# Patient Record
Sex: Female | Born: 1935 | Race: White | Hispanic: No | Smoking: Never smoker
Health system: Southern US, Community
[De-identification: ages and names within clinical notes are randomized; demographics above are authoritative.]

## PROBLEM LIST (undated history)

## (undated) DIAGNOSIS — M4800 Spinal stenosis, site unspecified: Secondary | ICD-10-CM

## (undated) DIAGNOSIS — T8859XA Other complications of anesthesia, initial encounter: Secondary | ICD-10-CM

## (undated) DIAGNOSIS — D649 Anemia, unspecified: Secondary | ICD-10-CM

## (undated) DIAGNOSIS — C801 Malignant (primary) neoplasm, unspecified: Secondary | ICD-10-CM

## (undated) DIAGNOSIS — C55 Malignant neoplasm of uterus, part unspecified: Secondary | ICD-10-CM

## (undated) DIAGNOSIS — I509 Heart failure, unspecified: Secondary | ICD-10-CM

## (undated) DIAGNOSIS — K219 Gastro-esophageal reflux disease without esophagitis: Secondary | ICD-10-CM

## (undated) DIAGNOSIS — T4145XA Adverse effect of unspecified anesthetic, initial encounter: Secondary | ICD-10-CM

## (undated) DIAGNOSIS — C50919 Malignant neoplasm of unspecified site of unspecified female breast: Secondary | ICD-10-CM

## (undated) DIAGNOSIS — Z8719 Personal history of other diseases of the digestive system: Secondary | ICD-10-CM

## (undated) DIAGNOSIS — M179 Osteoarthritis of knee, unspecified: Secondary | ICD-10-CM

## (undated) DIAGNOSIS — I10 Essential (primary) hypertension: Secondary | ICD-10-CM

## (undated) HISTORY — PX: APPENDECTOMY: SHX54

## (undated) HISTORY — PX: BREAST CYST EXCISION: SHX579

## (undated) HISTORY — DX: Heart failure, unspecified: I50.9

## (undated) HISTORY — PX: BREAST LUMPECTOMY: SHX2

## (undated) HISTORY — PX: CHOLECYSTECTOMY: SHX55

## (undated) HISTORY — PX: ABDOMINAL HYSTERECTOMY: SHX81

---
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
## 1978-01-29 HISTORY — PX: BREAST BIOPSY: SHX20

## 2003-01-30 DIAGNOSIS — C50919 Malignant neoplasm of unspecified site of unspecified female breast: Secondary | ICD-10-CM

## 2003-01-30 DIAGNOSIS — C801 Malignant (primary) neoplasm, unspecified: Secondary | ICD-10-CM

## 2003-01-30 HISTORY — DX: Malignant neoplasm of unspecified site of unspecified female breast: C50.919

## 2003-01-30 HISTORY — DX: Malignant (primary) neoplasm, unspecified: C80.1

## 2003-11-04 ENCOUNTER — Other Ambulatory Visit: Payer: Self-pay

## 2003-11-04 ENCOUNTER — Ambulatory Visit: Payer: Self-pay | Admitting: Surgery

## 2003-11-08 ENCOUNTER — Ambulatory Visit: Payer: Self-pay | Admitting: Surgery

## 2003-11-17 ENCOUNTER — Ambulatory Visit: Payer: Self-pay | Admitting: Internal Medicine

## 2003-11-24 ENCOUNTER — Inpatient Hospital Stay: Payer: Self-pay | Admitting: Surgery

## 2003-11-30 ENCOUNTER — Ambulatory Visit: Payer: Self-pay | Admitting: Internal Medicine

## 2003-12-30 ENCOUNTER — Ambulatory Visit: Payer: Self-pay | Admitting: Internal Medicine

## 2004-01-10 ENCOUNTER — Inpatient Hospital Stay: Payer: Self-pay | Admitting: Internal Medicine

## 2004-01-21 ENCOUNTER — Inpatient Hospital Stay: Payer: Self-pay | Admitting: Oncology

## 2004-01-25 ENCOUNTER — Inpatient Hospital Stay: Payer: Self-pay | Admitting: Internal Medicine

## 2004-01-30 ENCOUNTER — Ambulatory Visit: Payer: Self-pay | Admitting: Internal Medicine

## 2004-03-01 ENCOUNTER — Ambulatory Visit: Payer: Self-pay | Admitting: Internal Medicine

## 2004-03-29 ENCOUNTER — Ambulatory Visit: Payer: Self-pay | Admitting: Internal Medicine

## 2004-04-29 ENCOUNTER — Ambulatory Visit: Payer: Self-pay | Admitting: Internal Medicine

## 2004-05-29 ENCOUNTER — Ambulatory Visit: Payer: Self-pay | Admitting: Internal Medicine

## 2004-06-29 ENCOUNTER — Ambulatory Visit: Payer: Self-pay | Admitting: Internal Medicine

## 2004-07-29 ENCOUNTER — Ambulatory Visit: Payer: Self-pay | Admitting: Internal Medicine

## 2004-08-29 ENCOUNTER — Ambulatory Visit: Payer: Self-pay | Admitting: Internal Medicine

## 2004-09-29 ENCOUNTER — Ambulatory Visit: Payer: Self-pay | Admitting: Internal Medicine

## 2004-10-29 ENCOUNTER — Ambulatory Visit: Payer: Self-pay | Admitting: Internal Medicine

## 2004-11-29 ENCOUNTER — Ambulatory Visit: Payer: Self-pay | Admitting: Internal Medicine

## 2004-12-29 ENCOUNTER — Ambulatory Visit: Payer: Self-pay | Admitting: Internal Medicine

## 2005-01-29 ENCOUNTER — Ambulatory Visit: Payer: Self-pay | Admitting: Internal Medicine

## 2005-03-01 ENCOUNTER — Ambulatory Visit: Payer: Self-pay | Admitting: Internal Medicine

## 2005-03-29 ENCOUNTER — Ambulatory Visit: Payer: Self-pay | Admitting: Internal Medicine

## 2005-04-29 ENCOUNTER — Ambulatory Visit: Payer: Self-pay | Admitting: Internal Medicine

## 2005-05-02 ENCOUNTER — Ambulatory Visit: Payer: Self-pay | Admitting: Unknown Physician Specialty

## 2005-05-29 ENCOUNTER — Ambulatory Visit: Payer: Self-pay | Admitting: Internal Medicine

## 2005-06-29 ENCOUNTER — Ambulatory Visit: Payer: Self-pay | Admitting: Internal Medicine

## 2005-08-03 ENCOUNTER — Ambulatory Visit: Payer: Self-pay | Admitting: Radiation Oncology

## 2005-08-03 ENCOUNTER — Ambulatory Visit: Payer: Self-pay | Admitting: Surgery

## 2005-08-03 ENCOUNTER — Other Ambulatory Visit: Payer: Self-pay

## 2005-08-09 ENCOUNTER — Ambulatory Visit: Payer: Self-pay | Admitting: Surgery

## 2005-10-03 ENCOUNTER — Ambulatory Visit: Payer: Self-pay | Admitting: Internal Medicine

## 2005-10-29 ENCOUNTER — Ambulatory Visit: Payer: Self-pay | Admitting: Internal Medicine

## 2006-01-24 ENCOUNTER — Ambulatory Visit: Payer: Self-pay | Admitting: Surgery

## 2006-01-31 ENCOUNTER — Ambulatory Visit: Payer: Self-pay | Admitting: Internal Medicine

## 2006-03-01 ENCOUNTER — Ambulatory Visit: Payer: Self-pay | Admitting: Internal Medicine

## 2006-07-30 ENCOUNTER — Ambulatory Visit: Payer: Self-pay | Admitting: Internal Medicine

## 2006-08-27 ENCOUNTER — Ambulatory Visit: Payer: Self-pay

## 2006-08-30 ENCOUNTER — Ambulatory Visit: Payer: Self-pay | Admitting: Internal Medicine

## 2006-12-10 ENCOUNTER — Ambulatory Visit: Payer: Self-pay | Admitting: Ophthalmology

## 2006-12-10 ENCOUNTER — Other Ambulatory Visit: Payer: Self-pay

## 2006-12-11 ENCOUNTER — Ambulatory Visit: Payer: Self-pay | Admitting: Cardiology

## 2006-12-30 ENCOUNTER — Ambulatory Visit: Payer: Self-pay | Admitting: Internal Medicine

## 2007-01-01 ENCOUNTER — Ambulatory Visit: Payer: Self-pay | Admitting: Ophthalmology

## 2007-01-28 ENCOUNTER — Ambulatory Visit: Payer: Self-pay | Admitting: Internal Medicine

## 2007-01-30 ENCOUNTER — Ambulatory Visit: Payer: Self-pay | Admitting: Internal Medicine

## 2007-06-30 ENCOUNTER — Ambulatory Visit: Payer: Self-pay | Admitting: Internal Medicine

## 2007-07-28 ENCOUNTER — Ambulatory Visit: Payer: Self-pay | Admitting: Internal Medicine

## 2007-07-30 ENCOUNTER — Ambulatory Visit: Payer: Self-pay | Admitting: Internal Medicine

## 2007-08-30 ENCOUNTER — Ambulatory Visit: Payer: Self-pay | Admitting: Internal Medicine

## 2008-01-30 ENCOUNTER — Ambulatory Visit: Payer: Self-pay | Admitting: Internal Medicine

## 2008-02-02 ENCOUNTER — Ambulatory Visit: Payer: Self-pay | Admitting: Internal Medicine

## 2008-03-01 ENCOUNTER — Ambulatory Visit: Payer: Self-pay | Admitting: Internal Medicine

## 2008-07-29 ENCOUNTER — Ambulatory Visit: Payer: Self-pay | Admitting: Internal Medicine

## 2008-08-10 ENCOUNTER — Ambulatory Visit: Payer: Self-pay | Admitting: Internal Medicine

## 2008-08-29 ENCOUNTER — Ambulatory Visit: Payer: Self-pay | Admitting: Internal Medicine

## 2009-01-29 ENCOUNTER — Ambulatory Visit: Payer: Self-pay | Admitting: Internal Medicine

## 2009-02-09 ENCOUNTER — Ambulatory Visit: Payer: Self-pay | Admitting: Internal Medicine

## 2009-03-01 ENCOUNTER — Ambulatory Visit: Payer: Self-pay | Admitting: Internal Medicine

## 2009-08-29 ENCOUNTER — Ambulatory Visit: Payer: Self-pay | Admitting: Internal Medicine

## 2009-08-30 ENCOUNTER — Ambulatory Visit: Payer: Self-pay | Admitting: Internal Medicine

## 2009-09-06 ENCOUNTER — Ambulatory Visit: Payer: Self-pay | Admitting: Internal Medicine

## 2009-09-29 ENCOUNTER — Ambulatory Visit: Payer: Self-pay | Admitting: Internal Medicine

## 2009-11-02 ENCOUNTER — Ambulatory Visit: Payer: Self-pay | Admitting: Ophthalmology

## 2010-02-08 ENCOUNTER — Ambulatory Visit: Payer: Self-pay | Admitting: Internal Medicine

## 2010-03-01 ENCOUNTER — Ambulatory Visit: Payer: Self-pay | Admitting: Internal Medicine

## 2010-09-14 ENCOUNTER — Ambulatory Visit: Payer: Self-pay | Admitting: Internal Medicine

## 2010-12-29 ENCOUNTER — Ambulatory Visit: Payer: Self-pay | Admitting: Otolaryngology

## 2011-02-09 ENCOUNTER — Ambulatory Visit: Payer: Self-pay | Admitting: Internal Medicine

## 2011-02-09 LAB — CREATININE, SERUM
Creatinine: 1.22 mg/dL (ref 0.60–1.30)
EGFR (African American): 54 — ABNORMAL LOW

## 2011-02-09 LAB — CBC CANCER CENTER
Basophil #: 0 x10 3/mm (ref 0.0–0.1)
Basophil %: 0.6 %
Eosinophil #: 0.2 x10 3/mm (ref 0.0–0.7)
HCT: 34.2 % — ABNORMAL LOW (ref 35.0–47.0)
HGB: 12 g/dL (ref 12.0–16.0)
Lymphocyte %: 32.3 %
MCHC: 35.1 g/dL (ref 32.0–36.0)
Monocyte %: 7.5 %
Neutrophil #: 2.3 x10 3/mm (ref 1.4–6.5)
Platelet: 189 x10 3/mm (ref 150–440)
RDW: 14.3 % (ref 11.5–14.5)
WBC: 4.3 x10 3/mm (ref 3.6–11.0)

## 2011-02-09 LAB — HEPATIC FUNCTION PANEL A (ARMC)
Alkaline Phosphatase: 55 U/L (ref 50–136)
Bilirubin, Direct: 0.1 mg/dL (ref 0.00–0.20)
Bilirubin,Total: 0.4 mg/dL (ref 0.2–1.0)
SGPT (ALT): 21 U/L
Total Protein: 7.3 g/dL (ref 6.4–8.2)

## 2011-03-02 ENCOUNTER — Ambulatory Visit: Payer: Self-pay | Admitting: Internal Medicine

## 2011-07-11 ENCOUNTER — Ambulatory Visit: Payer: Self-pay | Admitting: Unknown Physician Specialty

## 2011-09-17 ENCOUNTER — Ambulatory Visit: Payer: Self-pay | Admitting: Internal Medicine

## 2011-09-18 ENCOUNTER — Ambulatory Visit: Payer: Self-pay | Admitting: Internal Medicine

## 2012-02-09 ENCOUNTER — Ambulatory Visit: Payer: Self-pay | Admitting: Internal Medicine

## 2012-02-11 LAB — CREATININE, SERUM
Creatinine: 1.03 mg/dL (ref 0.60–1.30)
EGFR (Non-African Amer.): 51 — ABNORMAL LOW

## 2012-02-11 LAB — HEPATIC FUNCTION PANEL A (ARMC)
Albumin: 4.1 g/dL (ref 3.4–5.0)
Bilirubin, Direct: 0.1 mg/dL (ref 0.00–0.20)
Bilirubin,Total: 0.5 mg/dL (ref 0.2–1.0)
SGOT(AST): 24 U/L (ref 15–37)
Total Protein: 7.8 g/dL (ref 6.4–8.2)

## 2012-02-11 LAB — CBC CANCER CENTER
Basophil #: 0 x10 3/mm (ref 0.0–0.1)
Basophil %: 0.7 %
Eosinophil %: 3 %
HCT: 37.2 % (ref 35.0–47.0)
HGB: 13 g/dL (ref 12.0–16.0)
Lymphocyte #: 1.6 x10 3/mm (ref 1.0–3.6)
MCH: 31.9 pg (ref 26.0–34.0)
MCHC: 35 g/dL (ref 32.0–36.0)
MCV: 91 fL (ref 80–100)
Monocyte %: 7.2 %
Neutrophil #: 3.8 x10 3/mm (ref 1.4–6.5)
RBC: 4.09 10*6/uL (ref 3.80–5.20)
RDW: 13.9 % (ref 11.5–14.5)
WBC: 6.1 x10 3/mm (ref 3.6–11.0)

## 2012-03-01 ENCOUNTER — Ambulatory Visit: Payer: Self-pay | Admitting: Internal Medicine

## 2012-03-29 ENCOUNTER — Ambulatory Visit: Payer: Self-pay | Admitting: Internal Medicine

## 2012-08-04 ENCOUNTER — Ambulatory Visit: Payer: Self-pay | Admitting: Internal Medicine

## 2013-02-11 ENCOUNTER — Ambulatory Visit: Payer: Self-pay | Admitting: Internal Medicine

## 2013-02-11 LAB — HEPATIC FUNCTION PANEL A (ARMC)
AST: 31 U/L (ref 15–37)
Albumin: 4 g/dL (ref 3.4–5.0)
Alkaline Phosphatase: 44 U/L — ABNORMAL LOW
BILIRUBIN TOTAL: 0.5 mg/dL (ref 0.2–1.0)
Bilirubin, Direct: 0.2 mg/dL (ref 0.00–0.20)
SGPT (ALT): 22 U/L (ref 12–78)
Total Protein: 7.8 g/dL (ref 6.4–8.2)

## 2013-02-11 LAB — CREATININE, SERUM
Creatinine: 0.94 mg/dL (ref 0.60–1.30)
EGFR (African American): >60
EGFR (Non-African Amer.): 56 — ABNORMAL LOW

## 2013-02-11 LAB — CBC CANCER CENTER
BASOS ABS: 0 x10 3/mm (ref 0.0–0.1)
Basophil %: 0.9 %
Eosinophil #: 0.2 x10 3/mm (ref 0.0–0.7)
Eosinophil %: 3.3 %
HCT: 38.7 % (ref 35.0–47.0)
HGB: 13.1 g/dL (ref 12.0–16.0)
Lymphocyte #: 1.3 x10 3/mm (ref 1.0–3.6)
Lymphocyte %: 25.4 %
MCH: 31.1 pg (ref 26.0–34.0)
MCHC: 33.9 g/dL (ref 32.0–36.0)
MCV: 92 fL (ref 80–100)
Monocyte #: 0.3 x10 3/mm (ref 0.2–0.9)
Monocyte %: 6.1 %
NEUTROS ABS: 3.4 x10 3/mm (ref 1.4–6.5)
Neutrophil %: 64.3 %
Platelet: 207 x10 3/mm (ref 150–440)
RBC: 4.21 10*6/uL (ref 3.80–5.20)
RDW: 14 % (ref 11.5–14.5)
WBC: 5.3 x10 3/mm (ref 3.6–11.0)

## 2013-02-16 LAB — URINALYSIS, COMPLETE
Bilirubin,UR: NEGATIVE
Blood: NEGATIVE
Glucose,UR: NEGATIVE mg/dL (ref 0–75)
Nitrite: NEGATIVE
Ph: 6 (ref 4.5–8.0)
RBC,UR: 3 /HPF (ref 0–5)
Specific Gravity: 1.018 (ref 1.003–1.030)
Squamous Epithelial: 1
WBC UR: 33 /HPF (ref 0–5)

## 2013-02-16 LAB — BASIC METABOLIC PANEL
Anion Gap: 9 (ref 7–16)
BUN: 21 mg/dL — ABNORMAL HIGH (ref 7–18)
CALCIUM: 9.8 mg/dL (ref 8.5–10.1)
CHLORIDE: 100 mmol/L (ref 98–107)
CO2: 28 mmol/L (ref 21–32)
CREATININE: 0.94 mg/dL (ref 0.60–1.30)
EGFR (African American): >60
GFR CALC NON AF AMER: 56 — AB
Glucose: 133 mg/dL — ABNORMAL HIGH (ref 65–99)
Osmolality: 279 (ref 275–301)
Potassium: 3.7 mmol/L (ref 3.5–5.1)
SODIUM: 137 mmol/L (ref 136–145)

## 2013-02-16 LAB — CBC
HCT: 39.5 % (ref 35.0–47.0)
HGB: 13.8 g/dL (ref 12.0–16.0)
MCH: 32.1 pg (ref 26.0–34.0)
MCHC: 34.8 g/dL (ref 32.0–36.0)
MCV: 92 fL (ref 80–100)
Platelet: 207 10*3/uL (ref 150–440)
RBC: 4.28 10*6/uL (ref 3.80–5.20)
RDW: 13.7 % (ref 11.5–14.5)
WBC: 7.5 10*3/uL (ref 3.6–11.0)

## 2013-02-16 LAB — MAGNESIUM: Magnesium: 1.5 mg/dL — ABNORMAL LOW

## 2013-02-16 LAB — TROPONIN I

## 2013-02-16 LAB — PRO B NATRIURETIC PEPTIDE: B-Type Natriuretic Peptide: 664 pg/mL — ABNORMAL HIGH (ref 0–450)

## 2013-02-17 ENCOUNTER — Observation Stay: Payer: Self-pay | Admitting: Family Medicine

## 2013-02-17 LAB — BASIC METABOLIC PANEL
ANION GAP: 6 — AB (ref 7–16)
BUN: 17 mg/dL (ref 7–18)
CHLORIDE: 104 mmol/L (ref 98–107)
Calcium, Total: 9.2 mg/dL (ref 8.5–10.1)
Co2: 28 mmol/L (ref 21–32)
Creatinine: 0.93 mg/dL (ref 0.60–1.30)
EGFR (Non-African Amer.): 57 — ABNORMAL LOW
GLUCOSE: 98 mg/dL (ref 65–99)
Osmolality: 277 (ref 275–301)
Potassium: 3.5 mmol/L (ref 3.5–5.1)
SODIUM: 138 mmol/L (ref 136–145)

## 2013-02-17 LAB — CBC WITH DIFFERENTIAL/PLATELET
BASOS PCT: 0.4 %
Basophil #: 0 10*3/uL (ref 0.0–0.1)
EOS ABS: 0.1 10*3/uL (ref 0.0–0.7)
Eosinophil %: 2.8 %
HCT: 32.4 % — AB (ref 35.0–47.0)
HGB: 11.4 g/dL — ABNORMAL LOW (ref 12.0–16.0)
LYMPHS PCT: 29.6 %
Lymphocyte #: 1.5 10*3/uL (ref 1.0–3.6)
MCH: 32 pg (ref 26.0–34.0)
MCHC: 35.1 g/dL (ref 32.0–36.0)
MCV: 91 fL (ref 80–100)
Monocyte #: 0.4 x10 3/mm (ref 0.2–0.9)
Monocyte %: 8.3 %
NEUTROS PCT: 58.9 %
Neutrophil #: 2.9 10*3/uL (ref 1.4–6.5)
Platelet: 184 10*3/uL (ref 150–440)
RBC: 3.56 10*6/uL — AB (ref 3.80–5.20)
RDW: 13.7 % (ref 11.5–14.5)
WBC: 4.9 10*3/uL (ref 3.6–11.0)

## 2013-02-17 LAB — MAGNESIUM: Magnesium: 2 mg/dL

## 2013-02-17 LAB — CK-MB: CK-MB: 2.2 ng/mL (ref 0.5–3.6)

## 2013-02-17 LAB — TROPONIN I

## 2013-02-18 LAB — URINE CULTURE

## 2013-02-18 LAB — LIPID PANEL
Cholesterol: 119 mg/dL (ref 0–200)
HDL Cholesterol: 52 mg/dL (ref 40–60)
Ldl Cholesterol, Calc: 46 mg/dL (ref 0–100)
TRIGLYCERIDES: 105 mg/dL (ref 0–200)
VLDL CHOLESTEROL, CALC: 21 mg/dL (ref 5–40)

## 2013-03-01 ENCOUNTER — Ambulatory Visit: Payer: Self-pay | Admitting: Internal Medicine

## 2013-04-08 ENCOUNTER — Ambulatory Visit: Payer: Self-pay | Admitting: Internal Medicine

## 2013-09-22 ENCOUNTER — Ambulatory Visit: Payer: Self-pay | Admitting: Family Medicine

## 2013-09-22 ENCOUNTER — Emergency Department: Payer: Self-pay | Admitting: Emergency Medicine

## 2013-09-25 ENCOUNTER — Encounter: Payer: Self-pay | Admitting: Family Medicine

## 2013-09-29 ENCOUNTER — Encounter: Payer: Self-pay | Admitting: Family Medicine

## 2013-10-29 ENCOUNTER — Encounter: Payer: Self-pay | Admitting: Family Medicine

## 2014-02-12 ENCOUNTER — Ambulatory Visit: Payer: Self-pay | Admitting: Internal Medicine

## 2014-02-12 LAB — CBC CANCER CENTER
BASOS ABS: 0 x10 3/mm (ref 0.0–0.1)
Basophil %: 0.8 %
EOS PCT: 4.7 %
Eosinophil #: 0.2 x10 3/mm (ref 0.0–0.7)
HCT: 34.7 % — ABNORMAL LOW (ref 35.0–47.0)
HGB: 11.9 g/dL — ABNORMAL LOW (ref 12.0–16.0)
Lymphocyte #: 1.8 x10 3/mm (ref 1.0–3.6)
Lymphocyte %: 36.6 %
MCH: 30.9 pg (ref 26.0–34.0)
MCHC: 34.2 g/dL (ref 32.0–36.0)
MCV: 90 fL (ref 80–100)
MONOS PCT: 9.6 %
Monocyte #: 0.5 x10 3/mm (ref 0.2–0.9)
NEUTROS ABS: 2.4 x10 3/mm (ref 1.4–6.5)
Neutrophil %: 48.3 %
Platelet: 189 x10 3/mm (ref 150–440)
RBC: 3.84 10*6/uL (ref 3.80–5.20)
RDW: 14.1 % (ref 11.5–14.5)
WBC: 5 x10 3/mm (ref 3.6–11.0)

## 2014-02-12 LAB — HEPATIC FUNCTION PANEL A (ARMC)
ALBUMIN: 3.6 g/dL (ref 3.4–5.0)
ALT: 15 U/L
AST: 17 U/L (ref 15–37)
Alkaline Phosphatase: 43 U/L — ABNORMAL LOW
Bilirubin, Direct: 0.1 mg/dL (ref 0.0–0.2)
Bilirubin,Total: 0.5 mg/dL (ref 0.2–1.0)
Total Protein: 7.1 g/dL (ref 6.4–8.2)

## 2014-02-12 LAB — CREATININE, SERUM
CREATININE: 1.03 mg/dL (ref 0.60–1.30)
EGFR (Non-African Amer.): 54 — ABNORMAL LOW

## 2014-03-01 ENCOUNTER — Ambulatory Visit: Payer: Self-pay | Admitting: Internal Medicine

## 2014-04-12 ENCOUNTER — Ambulatory Visit: Payer: Self-pay | Admitting: Internal Medicine

## 2014-05-22 NOTE — H&P (Signed)
PATIENT NAME:  Brandi Reid, Brandi Reid MR#:  [id number] DATE OF BIRTH:  10-13-1927  DATE OF ADMISSION:  02/16/2013  PRIMARY CARE PHYSICIAN: Dr. Domenick Gong.   REFERRING PHYSICIAN: Dr. Reita Cliche.   CHIEF COMPLAINT: Nausea and palpitations.   HISTORY OF PRESENT ILLNESS: The patient is an 81 year old, pleasant, Caucasian female with a past medical history of GERD, breast cancer status post treatment, now under surveillance, hiatal hernia. No significant heart history. Presenting to the ER with a chief complaint of nausea and palpitations. The patient actually called her son being not feeling well yesterday. She was nauseated and dry heaving. The patient's son took her to the Urgent Care where her blood pressure was extremely high and EKG was abnormal. They told him to take her to the ER immediately. The patient was brought into the ER by the son. Patients initial blood pressure was 234/99 with pulse 95. The patient was given labetalol 20 mg IV regarding her significantly high blood pressure. Subsequently, blood pressure trended down to 200 to 190s. She has received another dose of labetalol 10 mg IV, and blood pressure eventually trended down to 153/62 at around 12 hours 10 minutes. The patient's EKG has revealed bigeminy, and her magnesium being low at 1.5, the patient was given 2 g of magnesium sulfate IV. During my examination, the patient denies any chest pain or shortness of breath. The patient denies any similar complaints in the past. Not seen by any cardiologist in the past. Denies any abdominal pain, and nausea is improved. Son and daughter-in-law were at bedside. She has a history of breast cancer and  all of her treatments were discontinued in the year 2006.  PAST MEDICAL HISTORY: Hypertension, GERD, breast cancer, hiatal hernia, hemorrhoids.   PAST SURGICAL HISTORY: Hysterectomy in 1996 regarding uterine cancer, cholecystectomy, appendectomy, removal of breast cyst.   ALLERGIES: CODEINE, COMPAZINE  AND PENICILLIN.   PSYCHOSOCIAL HISTORY: Lives at home with husband. Denies any history of smoking, alcohol or illicit drug usage.   FAMILY HISTORY: Brother with history of prostate cancer and skin cancer. Maternal grandmother with stomach cancer. Maternal uncle had cancer in his bones.   REVIEW OF SYSTEMS:  CONSTITUTIONAL: Denies any fever or fatigue.  EYES: Denies blurry vision, double vision.  ENT: Denies epistaxis, discharge.  RESPIRATION: Denies cough or COPD.   CARDIOVASCULAR: Complaining of palpitations. Denies any chest pain.  GASTROINTESTINAL: Complaining of nausea. Denies vomiting, diarrhea, abdominal pain. Denies back pain.  GENITOURINARY: No dysuria or hematuria.  GYNECOLOGIC AND BREASTS: Has a history of breast cancer. Status post hysterectomy for uterine cancer.  ENDOCRINE: Denies polyuria, nocturia, thyroid problems.  HEMATOLOGIC AND LYMPHATIC: No anemia, easy bruising, bleeding.  INTEGUMENTARY: No acne, rash, lesions.  MUSCULOSKELETAL: No joint pain in the neck and back. Denies any gout.  NEUROLOGIC: Denies vertigo, ataxia.  PSYCHIATRIC: No ADD, OCD.   HOME MEDICATIONS: Pantoprazole 40 mg p.o. once daily, hydrochlorothiazide 25 mg once daily, Aleve on as needed basis.   PHYSICAL EXAMINATION:  VITAL SIGNS: Temperature 97.5, pulse 67, respirations 20, blood pressure is 153/62, pulse ox 98% on room air.  GENERAL APPEARANCE: Not in any acute distress. Moderately built and nourished.  HEENT: Normocephalic, atraumatic. Pupils are equally reacting to light and accommodation. No scleral icterus. No conjunctival injection. No sinus tenderness. Moist mucous membranes.  NECK: Supple. No JVD. No thyromegaly.  LUNGS: Clear to auscultation bilaterally. No accessory muscle usage. No anterior chest wall tenderness on palpation.  CARDIAC: S1, S2 normal. Regular rate and rhythm. No murmurs.  GASTROINTESTINAL: Soft. Bowel sounds are positive in all 4 quadrants. Nontender, nondistended. No  hepatosplenomegaly. No masses felt. No CVA tenderness.  NEUROLOGIC: Awake, alert, oriented x 3. Motor and sensory grossly intact. Reflexes are 2+.  EXTREMITIES: No edema. No cyanosis. No clubbing.  SKIN: Warm to touch. Normal turgor. No rashes. No lesions.  PSYCHIATRIC: Normal mood and affect.  MUSCULOSKELETAL: No joint effusion, tenderness, erythema. Peripheral pulses 2+.   LABORATORIES AND IMAGING STUDIES: A 12-lead EKG: Sinus tachycardia at 94 beats per minute. Normal PR interval. bigeminy. Glucose 133, BNP 664,  creatinine 0.94, sodium 137, potassium 3.7, chloride 100, CO2 28. GFR greater than 60. Anion gap 9. Serum osmolality 279. Calcium 9.8. Magnesium 1.5. Troponin less than 0.02. CBC is normal. Urinalysis: Ketones trace, nitrite negative, leukocyte esterase 3+. Chest x-ray, portable single view, has revealed no acute cardiopulmonary disease.   ASSESSMENT AND PLAN: An 81 year old Caucasian female brought into the Emergency Room for being sick. Seen by Urgent Care and sent over to the Emergency Room for abnormal EKG and palpitations.  1. Malignant hypertension: Will admit her to telemetry bed. Will start her on beta blocker and resume her home medications. Titrate medications on as needed basis.  2. Hypomagnesemia. Magnesium sulfate 2 g intravenous was given. Will check magnesium level in a.m. and also check chem-8. Cycle cardiac biomarkers. Echocardiogram will be obtained. Cardiology consult is placed to Dr. Saralyn Pilar.  3. Acute cystitis: The patient will be on intravenous levofloxacin. Urine culture is ordered which is pending at this time.  4. History of gastroesophageal reflux disease: The patient will be on proton pump inhibitor.  5. History of breast cancer, status post treatment and the patient is under surveillance: Follow up with oncology as an outpatient as scheduled.  6. Deep vein thrombosis prophylaxis with Lovenox subcutaneous.   Diagnosis and plan of care was discussed in detail  with the patient and her son at bedside. They all verbalized understanding of the plan.   TOTAL TIME SPENT ON ADMISSION: 45 minutes.   ____________________________ Nicholes Mango, MD ag:gb D: 02/17/2013 00:58:59 ET T: 02/17/2013 04:51:59 ET JOB#: [id number]  cc: Nicholes Mango, MD, <Dictator> Fonnie Jarvis. Ilene Qua, MD Isaias Cowman, MD Nicholes Mango MD ELECTRONICALLY SIGNED 03/06/2013 7:09

## 2014-05-22 NOTE — Discharge Summary (Signed)
PATIENT NAME:  Brandi Reid, Brandi Reid MR#:  [id number] DATE OF BIRTH:  [birth year]  DATE OF ADMISSION:  02/17/2013 DATE OF DISCHARGE:  02/18/2013  REASON FOR ADMISSION:  Nausea and palpitations.   FINAL DIAGNOSES: 1. Malignant hypertension.  2. Bigeminy with palpitations/cardiac arrhythmia.  3. Acute cystitis.  4. Gastroesophageal reflux.  5. Breast cancer.  6. Hypomagnesemia.   DISPOSITION: Home.   FOLLOWUP:  Dr. Cloretta Ned, cardiology at [facility].  Follow-up with Dr. Ilene Qua in 1 to 2 weeks as well.   MEDICATIONS AT DISCHARGE: Protonix 40 mg once a day, hydrochlorothiazide 25 mg once a day, aspirin 81 mg once daily, metoprolol 25 mg twice daily. Which is a new medication. Levaquin 250 mg once a day for five days.   HOSPITAL COURSE: This is a very nice 81 year old female who came with a history of feeling nauseous and having palpitations. The patient has GERD, breast cancer and she is under surveillance. She has not had any significant heart problems, but started having palpitations. Went to urgent care, her blood pressure was elevated, the 200s over 100s, 234/99, mostly with a pulse of 95. The patient received labetalol  IV with blood pressure coming down slowly.   The patient was noticed to have significant bigemini and her magnesium level was 1.5.   The patient also had some signs of urinary tract infection. Apparently increased frequency, although she takes hydrochlorothiazide. The patient had white blood cells in urine up to 33, for what we decided to treated it empirically. Urine culture was sent but is pending at this moment.   As far as her malignant hypertension. The patient was started on beta blocker, metoprolol 25 mg twice daily, and her blood pressure has been really good control during this last 24 hours for which she is going to be discharged on those medications.   We did an echocardiogram to evaluate any structural disease that will contribute to her arrhythmia and  malignant hypertension and overall the echocardiogram did not show any major abnormalities. Her ejection fraction was around 60%.   The patient also had treatment with Levaquin for five more days. The patient is discharged in good condition. Recommendations to eat magnesium rich foods or get a magnesium supplements at home over-the-counter to take for the next week or two.   The patient is discharged in good condition. Follow up with Dr. Edwin Dada as she wants to establish care with him. I spent about 45 minutes with this patient today. ____________________________ Tolna Sink, MD rsg:sg D: 02/18/2013 09:38:00 ET T: 02/18/2013 09:46:02 ET JOB#: [id number]  cc: [place] Sink, MD, <Dictator> Dr. Lessie Dings R. Ilene Qua, MD >   Brandi Reid America Brown MD ELECTRONICALLY SIGNED 02/22/2013 8:27

## 2014-05-22 NOTE — Consult Note (Signed)
PATIENT NAME:  Brandi Reid, GRUNDEN MR#:  [id number] DATE OF BIRTH:  11-19-1927  DATE OF CONSULTATION:  02/17/2013  REFERRING PHYSICIAN:   CONSULTING PHYSICIAN:  Isaias Cowman, MD  PRIMARY CARE PHYSICIAN:  Fonnie Jarvis. Ilene Qua, MD  CHIEF COMPLAINT: Nausea.   REASON FOR CONSULTATION: Consultation requested for evaluation of frequent PVCs and malignant hypertension.   HISTORY OF PRESENT ILLNESS: The patient is an 81 year old female with history of mild hypertension. She was in her usual state of health until day of admission, when she did not feel quite right, felt nauseated with dry heaving.  The patient was taken to the local urgent care where she was noted to be markedly hypertensive. The patient was taken to [facility] Emergency Room where initial blood pressure was 234/99. The patient was treated with intravenous labetalol with modest decrease in blood pressure. The patient received an additional dose with a blood pressure of 153/62 and was admitted to telemetry. Admission labs were notable for a negative troponin of 0.02. EKG did reveal frequent PVCs in a bigeminal pattern. The patient does have mild palpitations but denies chest pain.   PAST MEDICAL HISTORY: 1.  Hypertension.  2.  Gastroesophageal reflux disease.  3.  History of breast cancer.   MEDICATIONS ON ADMISSION: Hydrochlorothiazide 25 mg daily, pantoprazole 40 mg daily.   SOCIAL HISTORY: The patient is married and resides with her husband. She has a remote tobacco abuse history.   FAMILY HISTORY: No immediate family history of coronary artery disease or myocardial infarction.   REVIEW OF SYSTEMS: CONSTITUTIONAL: No fever or chills.  EYES: No blurry vision.  EARS: No hearing loss.  RESPIRATORY: No shortness of breath.  CARDIOVASCULAR: The patient has had palpitations.  GASTROINTESTINAL: The patient has had nausea with dry heaving.  GENITOURINARY:  No dysuria or hematuria.  ENDOCRINE: No polyuria or polydipsia.   HEMATOLOGICAL: No easy bruising or bleeding.  MUSCULOSKELETAL: No arthralgias or myalgias.  NEUROLOGICAL: No focal muscle weakness or numbness.  PSYCHOLOGICAL: No depression or anxiety.   PHYSICAL EXAMINATION: VITAL SIGNS: Blood pressure 131/73, pulse 64, respirations 18, temperature 98.1, pulse ox 94%.  HEENT: Pupils equal and reactive to light and accommodation.  NECK: Supple without thyromegaly.  LUNGS: Clear.  HEART: Normal JVP. Normal PMI. Regular rate and rhythm. Normal S1, S2. Grade 1/6 systolic murmur.  ABDOMEN: Soft and nontender without hepatosplenomegaly.  EXTREMITIES: No cyanosis, clubbing or edema. Pulses were intact bilaterally.  MUSCULOSKELETAL: Normal muscle tone.  NEUROLOGIC: The patient is alert and oriented x 3. Motor and sensory both grossly intact.   IMPRESSION: An 81 year old female who presents with hypertensive urgency as well as frequent premature ventricular contractions in a bigeminal pattern. Blood pressure is better controlled today. The patient appears to be ruling out for myocardial infarction by CPK isoenzymes and troponin.   RECOMMENDATIONS: 1.  Agree with overall current therapy.  2.  Pending the patient's blood pressure, may consider up-titrating beta blocker for control of blood pressure and PVCs.  3.  Review 2-D echocardiogram.  4.  Would defer further cardiac diagnostics at this time.   ____________________________ Isaias Cowman, MD ap:cs D: 02/17/2013 17:36:41 ET T: 02/17/2013 18:11:02 ET JOB#: [id number]  cc: Isaias Cowman, MD, <Dictator> Isaias Cowman MD ELECTRONICALLY SIGNED 03/17/2013 12:29

## 2015-04-19 ENCOUNTER — Other Ambulatory Visit: Payer: Self-pay | Admitting: Family Medicine

## 2015-04-19 DIAGNOSIS — Z1231 Encounter for screening mammogram for malignant neoplasm of breast: Secondary | ICD-10-CM

## 2015-04-19 DIAGNOSIS — Z853 Personal history of malignant neoplasm of breast: Secondary | ICD-10-CM

## 2015-04-20 ENCOUNTER — Ambulatory Visit
Admission: RE | Admit: 2015-04-20 | Discharge: 2015-04-20 | Disposition: A | Discharge status: Home / Self Care | Payer: Medicare Other | Source: Ambulatory Visit | Attending: Family Medicine | Admitting: Family Medicine

## 2015-04-20 DIAGNOSIS — Z853 Personal history of malignant neoplasm of breast: Secondary | ICD-10-CM

## 2015-04-20 DIAGNOSIS — Z1231 Encounter for screening mammogram for malignant neoplasm of breast: Secondary | ICD-10-CM | POA: Insufficient documentation

## 2015-04-20 HISTORY — DX: Malignant neoplasm of unspecified site of unspecified female breast: C50.919

## 2015-04-20 HISTORY — DX: Malignant (primary) neoplasm, unspecified: C80.1

## 2015-04-20 HISTORY — DX: Malignant neoplasm of uterus, part unspecified: C55

## 2016-03-18 ENCOUNTER — Encounter: Payer: Self-pay | Admitting: Emergency Medicine

## 2016-03-18 ENCOUNTER — Emergency Department: Payer: Medicare Other

## 2016-03-18 DIAGNOSIS — Z8542 Personal history of malignant neoplasm of other parts of uterus: Secondary | ICD-10-CM | POA: Insufficient documentation

## 2016-03-18 DIAGNOSIS — B029 Zoster without complications: Secondary | ICD-10-CM | POA: Insufficient documentation

## 2016-03-18 DIAGNOSIS — I10 Essential (primary) hypertension: Secondary | ICD-10-CM | POA: Insufficient documentation

## 2016-03-18 DIAGNOSIS — J189 Pneumonia, unspecified organism: Secondary | ICD-10-CM | POA: Diagnosis not present

## 2016-03-18 DIAGNOSIS — Z853 Personal history of malignant neoplasm of breast: Secondary | ICD-10-CM | POA: Insufficient documentation

## 2016-03-18 DIAGNOSIS — R531 Weakness: Secondary | ICD-10-CM | POA: Diagnosis present

## 2016-03-18 LAB — CBC
HCT: 32.2 % — ABNORMAL LOW (ref 35.0–47.0)
HEMOGLOBIN: 11.3 g/dL — AB (ref 12.0–16.0)
MCH: 31.8 pg (ref 26.0–34.0)
MCHC: 35.1 g/dL (ref 32.0–36.0)
MCV: 90.6 fL (ref 80.0–100.0)
PLATELETS: 183 10*3/uL (ref 150–440)
RBC: 3.55 MIL/uL — AB (ref 3.80–5.20)
RDW: 13.4 % (ref 11.5–14.5)
WBC: 8 10*3/uL (ref 3.6–11.0)

## 2016-03-18 LAB — BASIC METABOLIC PANEL
Anion gap: 9 (ref 5–15)
BUN: 22 mg/dL — ABNORMAL HIGH (ref 6–20)
CALCIUM: 8.8 mg/dL — AB (ref 8.9–10.3)
CO2: 25 mmol/L (ref 22–32)
CREATININE: 0.96 mg/dL (ref 0.44–1.00)
Chloride: 100 mmol/L — ABNORMAL LOW (ref 101–111)
GFR, EST NON AFRICAN AMERICAN: 52 mL/min — AB (ref >60)
Glucose, Bld: 143 mg/dL — ABNORMAL HIGH (ref 65–99)
Potassium: 3.3 mmol/L — ABNORMAL LOW (ref 3.5–5.1)
SODIUM: 134 mmol/L — AB (ref 135–145)

## 2016-03-18 NOTE — ED Triage Notes (Signed)
Pt presents to ED with productive cough and fever. Pt was dx with flu approx 1 week ago and has been taking tamiflu with one dose left. Pt family member states pt started to feel a little better with reduced fever and less severe cough. Pt symptoms worsened today with increase in fever and pt now c/o tightness in her chest and more frequent cough. Pt also has painful rash noted to her lower right side and family concerned pt may have shingles as well. Cluster of red blisters noted.

## 2016-03-19 ENCOUNTER — Emergency Department
Admission: EM | Admit: 2016-03-19 | Discharge: 2016-03-19 | Disposition: A | Discharge status: Home / Self Care | Payer: Medicare Other | Attending: Emergency Medicine | Admitting: Emergency Medicine

## 2016-03-19 DIAGNOSIS — J189 Pneumonia, unspecified organism: Secondary | ICD-10-CM

## 2016-03-19 DIAGNOSIS — B029 Zoster without complications: Secondary | ICD-10-CM

## 2016-03-19 DIAGNOSIS — R531 Weakness: Secondary | ICD-10-CM

## 2016-03-19 HISTORY — DX: Essential (primary) hypertension: I10

## 2016-03-19 MED ORDER — SODIUM CHLORIDE 0.9 % IV BOLUS (SEPSIS)
1000.0000 mL | Freq: Once | INTRAVENOUS | Status: AC
  Administered 2016-03-19: 1000 mL via INTRAVENOUS

## 2016-03-19 MED ORDER — POTASSIUM CHLORIDE CRYS ER 20 MEQ PO TBCR
20.0000 meq | EXTENDED_RELEASE_TABLET | Freq: Once | ORAL | Status: AC
  Administered 2016-03-19: 20 meq via ORAL
  Filled 2016-03-19: qty 1

## 2016-03-19 MED ORDER — AZITHROMYCIN 250 MG PO TABS: 250.0000 mg | ORAL_TABLET | Freq: Every day | ORAL | 0 refills | Status: DC

## 2016-03-19 MED ORDER — DEXTROSE 5 % IV SOLN
500.0000 mg | Freq: Once | INTRAVENOUS | Status: AC
  Administered 2016-03-19: 500 mg via INTRAVENOUS
  Filled 2016-03-19: qty 500

## 2016-03-19 NOTE — Discharge Instructions (Signed)
1. Finish antibiotic as prescribed (azithromycin 250 mg daily 4 days). Start your next dose Tuesday morning. 2. Return to the ER for worsening symptoms, persistent vomiting, difficulty breathing or other concerns.

## 2016-03-19 NOTE — ED Provider Notes (Signed)
[facility] Emergency Department Provider Note   ____________________________________________   First MD Initiated Contact with Patient 03/19/16 0107     (approximate)  I have reviewed the triage vital signs and the nursing notes.   HISTORY  Chief Complaint Fever; Cough; and Weakness    HPI Brandi Reid is a 83 y.o. female who presents to the ED from home with a chief complaint of fever and your doctor cough. Patient was diagnosed with the flu on Wednesday and started on Tamiflu; has 1 dose left. Thought she was feeling better but today developed chills, fever and worsening cough. Also reports painful rash to her right abdomen which developed on Wednesday. Reports her NP prescribed hydrocortisone cream which has not helped the rash.Complains of generalized weakness, nausea and decreased appetite. Denies associated chest pain, shortness of breath, abdominal pain, vomiting, diarrhea. Denies recent travel or trauma. Nothing makes her symptoms better or worse.   Past Medical History:  Diagnosis Date  . Breast cancer ([place]) 2005   left positive  . Cancer ([place]) 2005   lt lumpectomy/chemo and rad  . Hypertension   . Uterine cancer ([place])     There are no active problems to display for this patient.   Past Surgical History:  Procedure Laterality Date  . ABDOMINAL HYSTERECTOMY    . BREAST BIOPSY Right 1980   neg  . CHOLECYSTECTOMY      Prior to Admission medications   Medication Sig Start Date End Date Taking? Authorizing Provider  azithromycin (ZITHROMAX) 250 MG tablet Take 1 tablet (250 mg total) by mouth daily. 03/19/16   Paulette Blanch, MD    Allergies Compazine [prochlorperazine] and Penicillins  Family History  Problem Relation Age of Onset  . Breast cancer Daughter 2    Social History Social History  Substance Use Topics  . Smoking status: Never Smoker  . Smokeless tobacco: Never Used  . Alcohol use No    Review of  Systems  Constitutional: Positive for fever/chills, generalized weakness and decreased appetite. Eyes: No visual changes. ENT: No sore throat. Cardiovascular: Denies chest pain. Respiratory: Positive for cough. Denies shortness of breath. Gastrointestinal: No abdominal pain.  Positive for nausea, no vomiting.  No diarrhea.  No constipation. Genitourinary: Negative for dysuria. Musculoskeletal: Negative for back pain. Skin: Negative for rash. Neurological: Negative for headaches, focal weakness or numbness.  10-point ROS otherwise negative.  ____________________________________________   PHYSICAL EXAM:  VITAL SIGNS: ED Triage Vitals  Enc Vitals Group     BP 03/18/16 2140 (!) 120/43     Pulse Rate 03/18/16 2140 75     Resp 03/18/16 2140 20     Temp 03/18/16 2140 97.9 F (36.6 C)     Temp Source 03/18/16 2140 Oral     SpO2 03/18/16 2140 94 %     Weight 03/18/16 2142 150 lb (68 kg)     Height 03/18/16 2142 5\' 5"  (1.651 m)     Head Circumference --      Peak Flow --      Pain Score 03/18/16 2142 0     Pain Loc --      Pain Edu? --      Excl. in [place]? --     Constitutional: Alert and oriented. Frail appearing and in no acute distress. Eyes: Conjunctivae are normal. PERRL. EOMI. Head: Atraumatic. Nose: No congestion/rhinnorhea. Mouth/Throat: Mucous membranes are moist.  Oropharynx non-erythematous. Neck: No stridor.  Supple neck without meningismus. Cardiovascular: Normal rate, regular  rhythm. Grossly normal heart sounds.  Good peripheral circulation. Respiratory: Normal respiratory effort.  No retractions. Lungs CTAB. Gastrointestinal: Soft and nontender. No distention. No abdominal bruits. No CVA tenderness. Musculoskeletal: No lower extremity tenderness nor edema.  No joint effusions. Neurologic:  Normal speech and language. No gross focal neurologic deficits are appreciated. No gait instability. Skin:  Skin is warm, dry and intact. No petechiae. Patchy areas of  erythematous vesicles in various stages of healing and crusting to right lower trunk which does not cross the midline. Psychiatric: Mood and affect are normal. Speech and behavior are normal.  ____________________________________________   LABS (all labs ordered are listed, but only abnormal results are displayed)  Labs Reviewed  BASIC METABOLIC PANEL - Abnormal; Notable for the following:       Result Value   Sodium 134 (*)    Potassium 3.3 (*)    Chloride 100 (*)    Glucose, Bld 143 (*)    BUN 22 (*)    Calcium 8.8 (*)    GFR calc non Af Amer 52 (*)    All other components within normal limits  CBC - Abnormal; Notable for the following:    RBC 3.55 (*)    Hemoglobin 11.3 (*)    HCT 32.2 (*)    All other components within normal limits  URINALYSIS, COMPLETE (UACMP) WITH MICROSCOPIC  CBG MONITORING, ED   ____________________________________________  EKG  ED ECG REPORT I, Ahonesty Woodfin J, the attending physician, personally viewed and interpreted this ECG.   Date: 03/19/2016  EKG Time: 2135  Rate: 79  Rhythm: normal EKG, normal sinus rhythm  Axis: Normal  Intervals:none  ST&T Change: Nonspecific  ____________________________________________  RADIOLOGY  Chest 2 view interpreted per Dr. Tery Sanfilippo: 1. Retrocardiac airspace disease suggests left basilar pneumonia.  Follow-up imaging recommended to ensure resolution.  2. Borderline cardiomegaly with emphysema.   ____________________________________________   PROCEDURES  Procedure(s) performed: None  Procedures  Critical Care performed: No  ____________________________________________   INITIAL IMPRESSION / ASSESSMENT AND PLAN / ED COURSE  Pertinent labs & imaging results that were available during my care of the patient were reviewed by me and considered in my medical decision making (see chart for details).  83 year old female who presents with fever and cough in the setting of influenza. Small pneumonia  noted on chest x-ray. She is afebrile, not tachycardic without hypoxia and good blood pressure. Does not meet sepsis criteria. Will initiate IV fluid resuscitation, azithromycin and reassess. Will hold on antivirals for shingles as her rash has been ongoing for 5 days.      ____________________________________________   FINAL CLINICAL IMPRESSION(S) / ED DIAGNOSES  Final diagnoses:  Generalized weakness  Community acquired pneumonia of left lung, unspecified part of lung  Herpes zoster without complication      NEW MEDICATIONS STARTED DURING THIS VISIT:  New Prescriptions   AZITHROMYCIN (ZITHROMAX) 250 MG TABLET    Take 1 tablet (250 mg total) by mouth daily.     Note:  This document was prepared using Dragon voice recognition software and may include unintentional dictation errors.    Paulette Blanch, MD 03/19/16 [phone]

## 2018-10-28 ENCOUNTER — Other Ambulatory Visit: Payer: Self-pay

## 2018-10-28 ENCOUNTER — Encounter
Admission: RE | Admit: 2018-10-28 | Discharge: 2018-10-28 | Disposition: A | Discharge status: Home / Self Care | Payer: Medicare Other | Source: Ambulatory Visit | Attending: Orthopedic Surgery | Admitting: Orthopedic Surgery

## 2018-10-28 DIAGNOSIS — I10 Essential (primary) hypertension: Secondary | ICD-10-CM | POA: Diagnosis not present

## 2018-10-28 DIAGNOSIS — Z01818 Encounter for other preprocedural examination: Secondary | ICD-10-CM | POA: Diagnosis present

## 2018-10-28 DIAGNOSIS — Z20828 Contact with and (suspected) exposure to other viral communicable diseases: Secondary | ICD-10-CM | POA: Insufficient documentation

## 2018-10-28 HISTORY — DX: Personal history of other diseases of the digestive system: Z87.19

## 2018-10-28 HISTORY — DX: Other complications of anesthesia, initial encounter: T88.59XA

## 2018-10-28 HISTORY — DX: Anemia, unspecified: D64.9

## 2018-10-28 LAB — BASIC METABOLIC PANEL
Anion gap: 9 (ref 5–15)
BUN: 21 mg/dL (ref 8–23)
CO2: 27 mmol/L (ref 22–32)
Calcium: 9.9 mg/dL (ref 8.9–10.3)
Chloride: 92 mmol/L — ABNORMAL LOW (ref 98–111)
Creatinine, Ser: 0.87 mg/dL (ref 0.44–1.00)
GFR calc Af Amer: >60 mL/min (ref >60)
GFR calc non Af Amer: 59 mL/min — ABNORMAL LOW (ref >60)
Glucose, Bld: 98 mg/dL (ref 70–99)
Potassium: 4.5 mmol/L (ref 3.5–5.1)
Sodium: 128 mmol/L — ABNORMAL LOW (ref 135–145)

## 2018-10-28 LAB — SARS CORONAVIRUS 2 (TAT 6-24 HRS): SARS Coronavirus 2: NEGATIVE

## 2018-10-28 LAB — CBC
HCT: 30.8 % — ABNORMAL LOW (ref 36.0–46.0)
Hemoglobin: 10.8 g/dL — ABNORMAL LOW (ref 12.0–15.0)
MCH: 31.9 pg (ref 26.0–34.0)
MCHC: 35.1 g/dL (ref 30.0–36.0)
MCV: 90.9 fL (ref 80.0–100.0)
Platelets: 166 10*3/uL (ref 150–400)
RBC: 3.39 MIL/uL — ABNORMAL LOW (ref 3.87–5.11)
RDW: 13.7 % (ref 11.5–15.5)
WBC: 3.4 10*3/uL — ABNORMAL LOW (ref 4.0–10.5)
nRBC: 0 % (ref 0.0–0.2)

## 2018-10-28 NOTE — Patient Instructions (Signed)
Your procedure is scheduled on: 10-30-18 THURSDAY Report to Same Day Surgery 2nd floor medical mall [facility] Entrance-take elevator on left to 2nd floor.  Check in with surgery information desk.) To find out your arrival time please call [phone] between 1PM - 3PM on 10-29-18 [facility]  Remember: Instructions that are not followed completely may result in serious medical risk, up to and including death, or upon the discretion of your surgeon and anesthesiologist your surgery may need to be rescheduled.    _x___ 1. Do not eat food after midnight the night before your procedure. NO GUM OR CANDY AFTER MIDNIGHT. You may drink clear liquids up to 2 hours before you are scheduled to arrive at the hospital for your procedure.  Do not drink clear liquids within 2 hours of your scheduled arrival to the hospital.  Clear liquids include  --Water or Apple juice without pulp  --Gatorade  --Black Coffee or Clear Tea (No milk, no creamers, do not add anything to the coffee or Tea)   ____Ensure clear carbohydrate drink on the way to the hospital for bariatric patients  ____Ensure clear carbohydrate drink 3 hours before surgery.    __x__ 2. No Alcohol for 24 hours before or after surgery.   __x__3. No Smoking or e-cigarettes for 24 prior to surgery.  Do not use any chewable tobacco products for at least 6 hour prior to surgery   ____  4. Bring all medications with you on the day of surgery if instructed.    __x__ 5. Notify your doctor if there is any change in your medical condition     (cold, fever, infections).    x___6. On the morning of surgery brush your teeth with toothpaste and water.  You may rinse your mouth with mouth wash if you wish.  Do not swallow any toothpaste or mouthwash.   Do not wear jewelry, make-up, hairpins, clips or nail polish.  Do not wear lotions, powders, or perfumes.   Do not shave 48 hours prior to surgery. Men may shave face and neck.  Do not bring valuables  to the hospital.    [facility] is not responsible for any belongings or valuables.               Contacts, dentures or bridgework may not be worn into surgery.  Leave your suitcase in the car. After surgery it may be brought to your room.  For patients admitted to the hospital, discharge time is determined by your  treatment team.  _  Patients discharged the day of surgery will not be allowed to drive home.  You will need someone to drive you home and stay with you the night of your procedure.    Please read over the following fact sheets that you were given:   [facility] Preparing for Surgery  _x___ TAKE THE FOLLOWING MEDICATION THE MORNING OF SURGERY WITH A SMALL SIP OF WATER. These include:  1. METOPROLOL  2.  3.  4.  5.  6.  ____Fleets enema or Magnesium Citrate as directed.   _x___ Use CHG Soap or sage wipes as directed on instruction sheet   ____ Use inhalers on the day of surgery and bring to hospital day of surgery  ____ Stop Metformin and Janumet 2 days prior to surgery.    ____ Take 1/2 of usual insulin dose the night before surgery and none on the morning surgery.   ____ Follow recommendations from Cardiologist, Pulmonologist or PCP regarding stopping  Aspirin, Coumadin, Plavix ,Eliquis, Effient, or Pradaxa, and Pletal.  X____Stop Anti-inflammatories such as Advil, Aleve, Ibuprofen, Motrin, Naproxen, Naprosyn, Goodies powders or aspirin products NOW-OK to take Tylenol    _x___ Stop supplements until after surgery-STOP PRESERVISION NOW-MAY RESUME AFTER SURGERY   ____ Bring C-Pap to the hospital.

## 2018-10-28 NOTE — Pre-Procedure Instructions (Addendum)
Abnormal Ekg-Called Dr Ronelle Nigh regarding EKG. He compared todays EKG with the one from 2018 and wants cardiac clearance. Faxed this to Dr Rudene Christians.Called Myriam Jacobson and informed her of this clearance and that pt has not seen a cardiologist since 2015 (Dr Edwin Dada) at [facility]. Myriam Jacobson will get pt set up with cardiologist

## 2018-10-29 MED ORDER — CLINDAMYCIN PHOSPHATE 900 MG/50ML IV SOLN
900.0000 mg | Freq: Once | INTRAVENOUS | Status: AC
Start: 2018-10-29 — End: 2018-10-30
  Administered 2018-10-30: 900 mg via INTRAVENOUS

## 2018-10-30 ENCOUNTER — Ambulatory Visit
Admission: RE | Admit: 2018-10-30 | Discharge: 2018-10-30 | Disposition: A | Discharge status: Home / Self Care | Payer: Medicare Other | Attending: Orthopedic Surgery | Admitting: Orthopedic Surgery

## 2018-10-30 ENCOUNTER — Other Ambulatory Visit: Payer: Self-pay

## 2018-10-30 ENCOUNTER — Ambulatory Visit: Payer: Medicare Other | Admitting: Certified Registered"

## 2018-10-30 ENCOUNTER — Encounter
Admission: RE | Disposition: A | Discharge status: Home / Self Care | Payer: Self-pay | Source: Home / Self Care | Attending: Orthopedic Surgery

## 2018-10-30 ENCOUNTER — Encounter: Payer: Self-pay | Admitting: *Deleted

## 2018-10-30 ENCOUNTER — Ambulatory Visit: Payer: Medicare Other

## 2018-10-30 DIAGNOSIS — Z9049 Acquired absence of other specified parts of digestive tract: Secondary | ICD-10-CM | POA: Diagnosis not present

## 2018-10-30 DIAGNOSIS — F329 Major depressive disorder, single episode, unspecified: Secondary | ICD-10-CM | POA: Diagnosis not present

## 2018-10-30 DIAGNOSIS — Z806 Family history of leukemia: Secondary | ICD-10-CM | POA: Diagnosis not present

## 2018-10-30 DIAGNOSIS — I493 Ventricular premature depolarization: Secondary | ICD-10-CM | POA: Insufficient documentation

## 2018-10-30 DIAGNOSIS — Z9221 Personal history of antineoplastic chemotherapy: Secondary | ICD-10-CM | POA: Insufficient documentation

## 2018-10-30 DIAGNOSIS — Z880 Allergy status to penicillin: Secondary | ICD-10-CM | POA: Diagnosis not present

## 2018-10-30 DIAGNOSIS — Z90710 Acquired absence of both cervix and uterus: Secondary | ICD-10-CM | POA: Insufficient documentation

## 2018-10-30 DIAGNOSIS — Z923 Personal history of irradiation: Secondary | ICD-10-CM | POA: Insufficient documentation

## 2018-10-30 DIAGNOSIS — E785 Hyperlipidemia, unspecified: Secondary | ICD-10-CM | POA: Diagnosis not present

## 2018-10-30 DIAGNOSIS — Z7982 Long term (current) use of aspirin: Secondary | ICD-10-CM | POA: Diagnosis not present

## 2018-10-30 DIAGNOSIS — Z9010 Acquired absence of unspecified breast and nipple: Secondary | ICD-10-CM | POA: Insufficient documentation

## 2018-10-30 DIAGNOSIS — Z8249 Family history of ischemic heart disease and other diseases of the circulatory system: Secondary | ICD-10-CM | POA: Insufficient documentation

## 2018-10-30 DIAGNOSIS — Z419 Encounter for procedure for purposes other than remedying health state, unspecified: Secondary | ICD-10-CM

## 2018-10-30 DIAGNOSIS — Z8542 Personal history of malignant neoplasm of other parts of uterus: Secondary | ICD-10-CM | POA: Diagnosis not present

## 2018-10-30 DIAGNOSIS — M4856XA Collapsed vertebra, not elsewhere classified, lumbar region, initial encounter for fracture: Secondary | ICD-10-CM | POA: Insufficient documentation

## 2018-10-30 DIAGNOSIS — R008 Other abnormalities of heart beat: Secondary | ICD-10-CM | POA: Insufficient documentation

## 2018-10-30 DIAGNOSIS — Z803 Family history of malignant neoplasm of breast: Secondary | ICD-10-CM | POA: Insufficient documentation

## 2018-10-30 DIAGNOSIS — Z853 Personal history of malignant neoplasm of breast: Secondary | ICD-10-CM | POA: Insufficient documentation

## 2018-10-30 DIAGNOSIS — K449 Diaphragmatic hernia without obstruction or gangrene: Secondary | ICD-10-CM | POA: Diagnosis not present

## 2018-10-30 DIAGNOSIS — M1712 Unilateral primary osteoarthritis, left knee: Secondary | ICD-10-CM | POA: Insufficient documentation

## 2018-10-30 DIAGNOSIS — Z888 Allergy status to other drugs, medicaments and biological substances status: Secondary | ICD-10-CM | POA: Insufficient documentation

## 2018-10-30 DIAGNOSIS — I10 Essential (primary) hypertension: Secondary | ICD-10-CM | POA: Diagnosis not present

## 2018-10-30 DIAGNOSIS — K219 Gastro-esophageal reflux disease without esophagitis: Secondary | ICD-10-CM | POA: Insufficient documentation

## 2018-10-30 DIAGNOSIS — Z79899 Other long term (current) drug therapy: Secondary | ICD-10-CM | POA: Insufficient documentation

## 2018-10-30 DIAGNOSIS — Z823 Family history of stroke: Secondary | ICD-10-CM | POA: Insufficient documentation

## 2018-10-30 DIAGNOSIS — M4800 Spinal stenosis, site unspecified: Secondary | ICD-10-CM | POA: Diagnosis present

## 2018-10-30 HISTORY — PX: KYPHOPLASTY: SHX5884

## 2018-10-30 SURGERY — KYPHOPLASTY
Anesthesia: General | Site: Back

## 2018-10-30 MED ORDER — LABETALOL HCL 5 MG/ML IV SOLN
INTRAVENOUS | Status: DC | PRN
  Administered 2018-10-30 (×2): 5 mg via INTRAVENOUS

## 2018-10-30 MED ORDER — BUPIVACAINE-EPINEPHRINE (PF) 0.5% -1:200000 IJ SOLN
INTRAMUSCULAR | Status: AC
  Filled 2018-10-30: qty 30

## 2018-10-30 MED ORDER — HYDROCODONE-ACETAMINOPHEN 5-325 MG PO TABS: 1.0000 | ORAL_TABLET | Freq: Four times a day (QID) | ORAL | 0 refills | Status: DC | PRN

## 2018-10-30 MED ORDER — HYDRALAZINE HCL 20 MG/ML IJ SOLN
INTRAMUSCULAR | Status: AC
  Filled 2018-10-30: qty 1

## 2018-10-30 MED ORDER — GLYCOPYRROLATE 0.2 MG/ML IJ SOLN
INTRAMUSCULAR | Status: DC | PRN
  Administered 2018-10-30: 0.2 mg via INTRAVENOUS

## 2018-10-30 MED ORDER — PROPOFOL 10 MG/ML IV BOLUS
INTRAVENOUS | Status: AC
  Filled 2018-10-30: qty 20

## 2018-10-30 MED ORDER — LIDOCAINE HCL 1 % IJ SOLN
INTRAMUSCULAR | Status: DC | PRN
  Administered 2018-10-30: 5 mL
  Administered 2018-10-30: 45 mL

## 2018-10-30 MED ORDER — OXYCODONE HCL 5 MG PO TABS: 5.0000 mg | ORAL_TABLET | Freq: Once | ORAL | Status: DC | PRN

## 2018-10-30 MED ORDER — LIDOCAINE 2% (20 MG/ML) 5 ML SYRINGE
INTRAMUSCULAR | Status: DC | PRN
  Administered 2018-10-30: 25 mg via INTRAVENOUS

## 2018-10-30 MED ORDER — PROPOFOL 500 MG/50ML IV EMUL
INTRAVENOUS | Status: DC | PRN
  Administered 2018-10-30: 25 ug/kg/min via INTRAVENOUS

## 2018-10-30 MED ORDER — LACTATED RINGERS IV SOLN
INTRAVENOUS | Status: DC
  Administered 2018-10-30: 12:00:00 via INTRAVENOUS

## 2018-10-30 MED ORDER — KETAMINE HCL 50 MG/ML IJ SOLN
INTRAMUSCULAR | Status: DC | PRN
  Administered 2018-10-30: 10 mg via INTRAMUSCULAR
  Administered 2018-10-30: 15 mg via INTRAVENOUS

## 2018-10-30 MED ORDER — LIDOCAINE HCL (PF) 1 % IJ SOLN
INTRAMUSCULAR | Status: AC
  Filled 2018-10-30: qty 30

## 2018-10-30 MED ORDER — OXYCODONE HCL 5 MG/5ML PO SOLN: 5.0000 mg | Freq: Once | ORAL | Status: DC | PRN

## 2018-10-30 MED ORDER — HYDRALAZINE HCL 20 MG/ML IJ SOLN
5.0000 mg | Freq: Once | INTRAMUSCULAR | Status: AC
  Administered 2018-10-30: 14:00:00 5 mg via INTRAVENOUS

## 2018-10-30 MED ORDER — LIDOCAINE HCL (PF) 2 % IJ SOLN
INTRAMUSCULAR | Status: AC
  Filled 2018-10-30: qty 10

## 2018-10-30 MED ORDER — ONDANSETRON HCL 4 MG/2ML IJ SOLN
4.0000 mg | Freq: Once | INTRAMUSCULAR | Status: AC
  Administered 2018-10-30: 15:00:00 4 mg via INTRAVENOUS

## 2018-10-30 MED ORDER — FENTANYL CITRATE (PF) 100 MCG/2ML IJ SOLN: 25.0000 ug | INTRAMUSCULAR | Status: DC | PRN

## 2018-10-30 MED ORDER — FAMOTIDINE 20 MG PO TABS
20.0000 mg | ORAL_TABLET | Freq: Once | ORAL | Status: AC
  Administered 2018-10-30: 11:00:00 20 mg via ORAL

## 2018-10-30 MED ORDER — PROPOFOL 10 MG/ML IV BOLUS
INTRAVENOUS | Status: DC | PRN
  Administered 2018-10-30: 20 mg via INTRAVENOUS

## 2018-10-30 MED ORDER — IOHEXOL 180 MG/ML  SOLN
INTRAMUSCULAR | Status: DC | PRN
  Administered 2018-10-30: 20 mL

## 2018-10-30 MED ORDER — FAMOTIDINE 20 MG PO TABS
ORAL_TABLET | ORAL | Status: AC
  Administered 2018-10-30: 11:00:00 20 mg via ORAL
  Filled 2018-10-30: qty 1

## 2018-10-30 MED ORDER — ONDANSETRON HCL 4 MG/2ML IJ SOLN
INTRAMUSCULAR | Status: AC
  Administered 2018-10-30: 15:00:00 4 mg via INTRAVENOUS
  Filled 2018-10-30: qty 2

## 2018-10-30 MED ORDER — CLINDAMYCIN PHOSPHATE 900 MG/50ML IV SOLN
INTRAVENOUS | Status: AC
  Filled 2018-10-30: qty 50

## 2018-10-30 MED ORDER — BUPIVACAINE-EPINEPHRINE (PF) 0.5% -1:200000 IJ SOLN
INTRAMUSCULAR | Status: DC | PRN
  Administered 2018-10-30: 40 mL

## 2018-10-30 MED ORDER — GLYCOPYRROLATE 0.2 MG/ML IJ SOLN
INTRAMUSCULAR | Status: AC
  Filled 2018-10-30: qty 1

## 2018-10-30 SURGICAL SUPPLY — 20 items
CEMENT KYPHON CX01A KIT/MIXER (Cement) ×3 IMPLANT
COVER WAND RF STERILE (DRAPES) ×3 IMPLANT
DERMABOND ADVANCED (GAUZE/BANDAGES/DRESSINGS) ×4
DERMABOND ADVANCED .7 DNX12 (GAUZE/BANDAGES/DRESSINGS) ×1 IMPLANT
DEVICE BIOPSY BONE KYPHX (INSTRUMENTS) ×3 IMPLANT
DRAPE C-ARM XRAY 36X54 (DRAPES) ×3 IMPLANT
DURAPREP 26ML APPLICATOR (WOUND CARE) ×3 IMPLANT
FEE RENTAL RFA GENERATOR (MISCELLANEOUS) IMPLANT
GLOVE SURG SYN 9.0  PF PI (GLOVE) ×2
GLOVE SURG SYN 9.0 PF PI (GLOVE) ×1 IMPLANT
GOWN SRG 2XL LVL 4 RGLN SLV (GOWNS) ×1 IMPLANT
GOWN STRL NON-REIN 2XL LVL4 (GOWNS) ×2
GOWN STRL REUS W/ TWL LRG LVL3 (GOWN DISPOSABLE) ×1 IMPLANT
GOWN STRL REUS W/TWL LRG LVL3 (GOWN DISPOSABLE) ×2
PACK KYPHOPLASTY (MISCELLANEOUS) ×3 IMPLANT
RENTAL RFA  GENERATOR (MISCELLANEOUS)
RENTAL RFA GENERATOR (MISCELLANEOUS) IMPLANT
STRAP SAFETY 5IN WIDE (MISCELLANEOUS) ×3 IMPLANT
TRAY KYPHOPAK 15/3 EXPRESS 1ST (MISCELLANEOUS) ×5 IMPLANT
TRAY KYPHOPAK 20/3 EXPRESS 1ST (MISCELLANEOUS) ×1 IMPLANT

## 2018-10-30 NOTE — Anesthesia Post-op Follow-up Note (Signed)
Anesthesia QCDR form completed.        

## 2018-10-30 NOTE — OR Nursing (Signed)
Patient with systolic BP of [id number]. Dr. Amie Critchley notified. Patient is in pain and procedure is soon to start.

## 2018-10-30 NOTE — Op Note (Signed)
Date Oct 30, 2018  Time  1:20 pm   PATIENT: Brandi Reid   PRE-OPERATIVE DIAGNOSIS:  closed wedge compression fracture of L2, 3   POST-OPERATIVE DIAGNOSIS:  closed wedge compression fracture of L 2, 3   PROCEDURE:  Procedure(s): KYPHOPLASTY L 2, 3  SURGEON: Laurene Footman, MD   ASSISTANTS: None   ANESTHESIA:   local and MAC   EBL:  No intake/output data recorded.   BLOOD ADMINISTERED:none   DRAINS: none    LOCAL MEDICATIONS USED:  MARCAINE    and XYLOCAINE    SPECIMEN:   L2 and L3 vertebral body biopsies   DISPOSITION OF SPECIMEN:  Pathology   COUNTS:  YES   TOURNIQUET:  * No tourniquets in log *   IMPLANTS: Bone cement   DICTATION: .Dragon Dictation  patient was brought to the operating room and after adequate anesthesia was obtained the patient was placed prone.  C arm was brought in in good visualization of the affected level obtained on both AP and lateral projections.  After patient identification and timeout procedures were completed, local anesthetic was infiltrated with 10 cc 1% Xylocaine infiltrated subcutaneously.  This is done the area on the both sides of the planned approach at L 2 and L3.  The back was then prepped and draped in the usual sterile manner and repeat timeout procedure carried out.  A spinal needle was brought down to the pedicle on the both sides of L2 and L3 and a 50-50 mix of 1% Xylocaine half percent Sensorcaine with epinephrine total of 20 cc injected at each site.  After allowing this to set a small incision was made and the trocar was advanced into the vertebral body in an trans-pedicular fashion on each side and both levels.  Biopsy was obtained at each level Drilling was carried out balloon inserted with inflation to  about 2-1/2 cc for each level.  When the cement was appropriate consistency 3 cc were injected into each side of the vertebral body without extravasation, good fill superior to inferior endplates and from right to left sides  along the inferior endplate.  After the cement had set the trochar was removed and permanent C-arm views obtained.  The wound was closed with Dermabond followed by Band-Aid   PLAN OF CARE: Discharge to home after PACU   PATIENT DISPOSITION:  PACU - hemodynamically stable.

## 2018-10-30 NOTE — Discharge Instructions (Signed)
Take it easy today and tomorrow.  Try to resume more normal activities starting on Thursday.  Try not to lift anything over 5 pounds for the next 2 weeks.  Remove Band-Aids on Thursday then okay to shower.  Call office if you are having problems.  AMBULATORY SURGERY  DISCHARGE INSTRUCTIONS   1) The drugs that you were given will stay in your system until tomorrow so for the next 24 hours you should not:  A) Drive an automobile B) Make any legal decisions C) Drink any alcoholic beverage   2) You may resume regular meals tomorrow.  Today it is better to start with liquids and gradually work up to solid foods.  You may eat anything you prefer, but it is better to start with liquids, then soup and crackers, and gradually work up to solid foods.   3) Please notify your doctor immediately if you have any unusual bleeding, trouble breathing, redness and pain at the surgery site, drainage, fever, or pain not relieved by medication.    4) Additional Instructions:        Please contact your physician with any problems or Same Day Surgery at [phone], Monday through Friday 6 am to 4 pm, or [place] at [facility] number at [phone].

## 2018-10-30 NOTE — Anesthesia Preprocedure Evaluation (Signed)
Anesthesia Evaluation  Patient identified by MRN, date of birth, ID band Patient awake    Reviewed: Allergy & Precautions, H&P , NPO status , Patient's Chart, lab work & pertinent test results  History of Anesthesia Complications (+) history of anesthetic complications  Airway Mallampati: III  TM Distance: <3 FB Neck ROM: limited    Dental  (+) Upper Dentures, Lower Dentures   Pulmonary neg pulmonary ROS, neg shortness of breath,           Cardiovascular Exercise Tolerance: Good hypertension, (-) angina(-) Past MI and (-) DOE      Neuro/Psych negative neurological ROS  negative psych ROS   GI/Hepatic Neg liver ROS, hiatal hernia, GERD  Medicated and Controlled,  Endo/Other  negative endocrine ROS  Renal/GU negative Renal ROS  negative genitourinary   Musculoskeletal   Abdominal   Peds  Hematology negative hematology ROS (+)   Anesthesia Other Findings Past Medical History: No date: Anemia 2005: Breast cancer ([place])     Comment:  left positive 2005: Cancer ([place])     Comment:  lt lumpectomy/chemo and rad No date: Complication of anesthesia     Comment:  pt states she broke out in a sweat and felt like she was              going to pass out during a dental procedure-no problems               with surgeries though No date: History of hiatal hernia No date: Hypertension No date: Uterine cancer ([place])  Past Surgical History: No date: ABDOMINAL HYSTERECTOMY No date: APPENDECTOMY     Comment:  age [age]: BREAST BIOPSY; Right     Comment:  neg No date: BREAST CYST EXCISION; Right No date: CHOLECYSTECTOMY     Reproductive/Obstetrics negative OB ROS                             Anesthesia Physical Anesthesia Plan  ASA: III  Anesthesia Plan: General   Post-op Pain Management:    Induction: Intravenous  PONV Risk Score and Plan: Propofol infusion and TIVA  Airway Management  Planned: Natural Airway and Nasal Cannula  Additional Equipment:   Intra-op Plan:   Post-operative Plan:   Informed Consent: I have reviewed the patients History and Physical, chart, labs and discussed the procedure including the risks, benefits and alternatives for the proposed anesthesia with the patient or authorized representative who has indicated his/her understanding and acceptance.     Dental Advisory Given  Plan Discussed with: Anesthesiologist, CRNA and Surgeon  Anesthesia Plan Comments: (Patient consented for risks of anesthesia including but not limited to:  - adverse reactions to medications - risk of intubation if required - damage to teeth, lips or other oral mucosa - sore throat or hoarseness - Damage to heart, brain, lungs or loss of life  Patient voiced understanding.)        Anesthesia Quick Evaluation

## 2018-10-30 NOTE — Transfer of Care (Signed)
Immediate Anesthesia Transfer of Care Note  Patient: Brandi Reid  Procedure(s) Performed: L2 , L3 KYPHOPLASTY (N/A Back)  Patient Location: PACU  Anesthesia Type:General  Level of Consciousness: awake  Airway & Oxygen Therapy: Patient Spontanous Breathing and Patient connected to nasal cannula oxygen  Post-op Assessment: Report given to RN and Post -op Vital signs reviewed and stable  Post vital signs: Reviewed  Last Vitals:  Vitals Value Taken Time  BP 164/68 10/30/18 1323  Temp 36.2 C 10/30/18 1323  Pulse 67 10/30/18 1323  Resp 16 10/30/18 1323  SpO2 100 % 10/30/18 1323    Last Pain:  Vitals:   10/30/18 1116  TempSrc: Temporal  PainSc: 7       Patients Stated Pain Goal: 3 ([id number])  Complications: No apparent anesthesia complications

## 2018-10-30 NOTE — H&P (Signed)
Reviewed paper H+P, will be scanned into chart. No changes noted.  

## 2018-10-30 NOTE — Anesthesia Postprocedure Evaluation (Signed)
Anesthesia Post Note  Patient: Water quality scientist  Procedure(s) Performed: L2 , L3 KYPHOPLASTY (N/A Back)  Patient location during evaluation: PACU Anesthesia Type: General Level of consciousness: awake and alert Pain management: pain level controlled Vital Signs Assessment: post-procedure vital signs reviewed and stable Respiratory status: spontaneous breathing, nonlabored ventilation, respiratory function stable and patient connected to nasal cannula oxygen Cardiovascular status: blood pressure returned to baseline and stable Postop Assessment: no apparent nausea or vomiting Anesthetic complications: no     Last Vitals:  Vitals:   10/30/18 1407 10/30/18 1436  BP: (!) 161/64 (!) 147/49  Pulse: 71 68  Resp: 13 16  Temp:  36.6 C  SpO2: 100% 100%    Last Pain:  Vitals:   10/30/18 1436  TempSrc: Temporal  PainSc: 2                  Brandi Reid

## 2018-10-31 ENCOUNTER — Encounter: Payer: Self-pay | Admitting: Orthopedic Surgery

## 2018-10-31 LAB — SURGICAL PATHOLOGY

## 2018-11-17 ENCOUNTER — Other Ambulatory Visit: Payer: Self-pay | Admitting: Orthopedic Surgery

## 2018-11-17 DIAGNOSIS — M4807 Spinal stenosis, lumbosacral region: Secondary | ICD-10-CM

## 2018-11-18 ENCOUNTER — Ambulatory Visit
Admission: RE | Admit: 2018-11-18 | Discharge: 2018-11-18 | Disposition: A | Discharge status: Home / Self Care | Payer: Medicare Other | Source: Ambulatory Visit | Attending: Orthopedic Surgery | Admitting: Orthopedic Surgery

## 2018-11-18 ENCOUNTER — Other Ambulatory Visit: Payer: Self-pay

## 2018-11-18 DIAGNOSIS — M4807 Spinal stenosis, lumbosacral region: Secondary | ICD-10-CM | POA: Diagnosis present

## 2019-02-04 ENCOUNTER — Ambulatory Visit: Payer: Medicare Other | Attending: Internal Medicine

## 2019-02-04 DIAGNOSIS — Z20822 Contact with and (suspected) exposure to covid-19: Secondary | ICD-10-CM

## 2019-02-05 LAB — NOVEL CORONAVIRUS, NAA: SARS-CoV-2, NAA: DETECTED — AB

## 2021-07-27 IMAGING — MR MR LUMBAR SPINE W/O CM
4 of 5 series · 33 of 48 positions shown · non-contrast
Comparison: C-arm images 10 [DATE].  Lumbar MRI 07/11/2011

CLINICAL DATA: Kyphoplasty L2 and L3 10/30/2018. Low back pain with
left leg pain

EXAM:
MRI LUMBAR SPINE WITHOUT CONTRAST
TECHNIQUE: Multiplanar, multisequence MR imaging of the lumbar spine was
performed. No intravenous contrast was administered.

[Series 5: T2 · sagittal · 4.0mm · 0.81mm/px · 8 of 19 slices shown (1 of 2)]
[im 1/19]
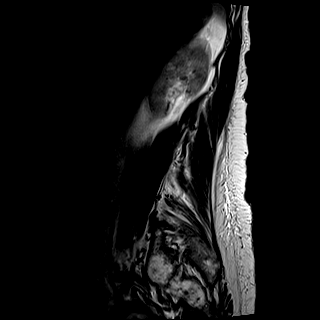
[im 3/19]
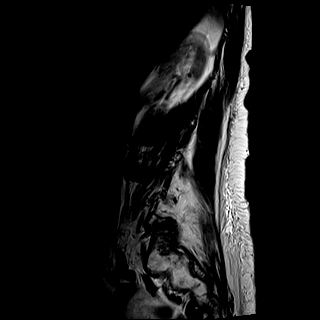
[im 6/19]
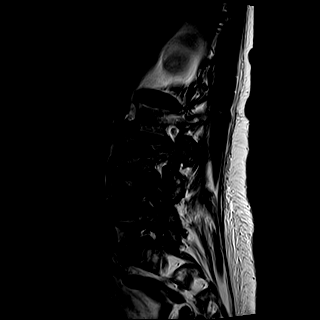
[im 8/19]
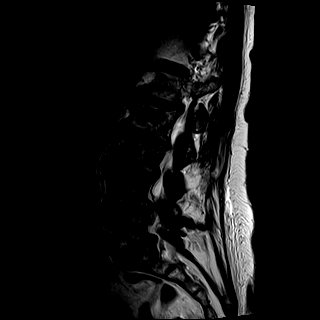
[im 11/19]
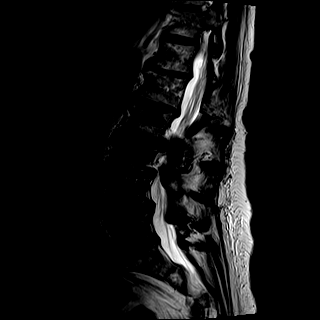
[im 13/19]
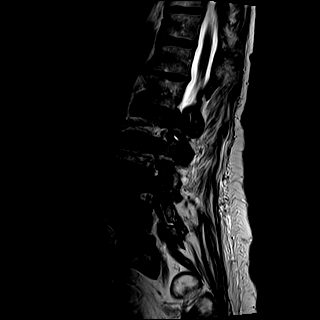
[im 16/19]
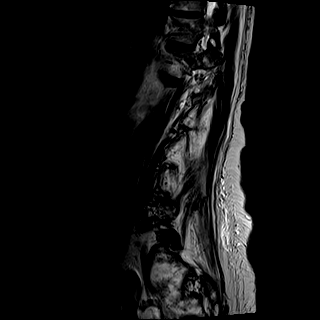
[im 19/19]
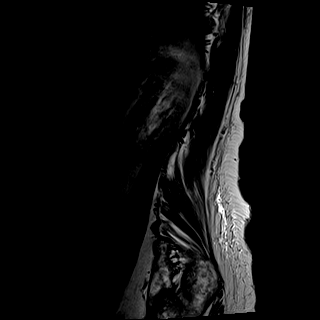

[Series 6: T1 · sagittal · 4.0mm · 0.81mm/px · 7 of 19 slices shown (1 of 2)]
[im 1/19]
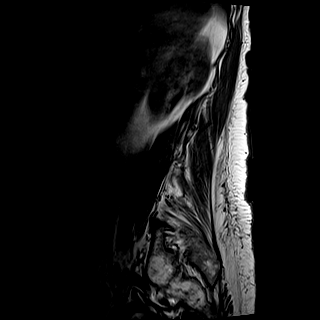
[im 4/19]
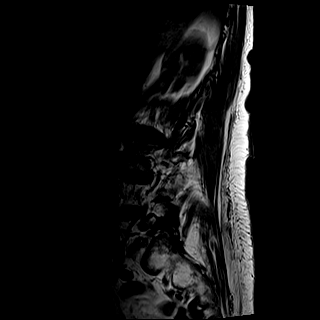
[im 7/19]
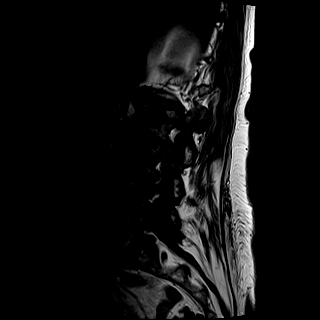
[im 10/19]
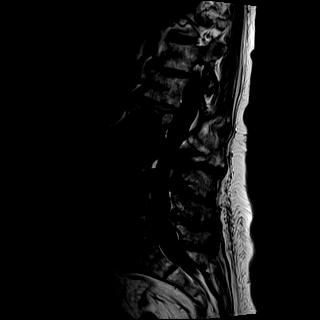
[im 13/19]
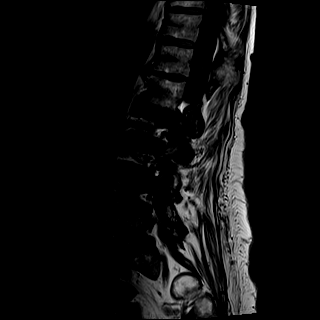
[im 16/19]
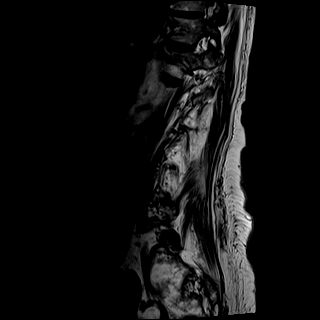
[im 19/19]
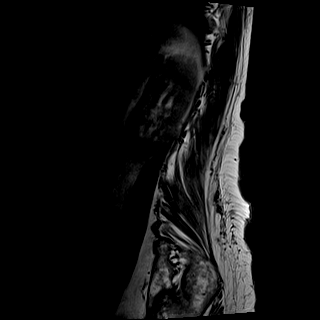

[Series 8: T2 · axial · 4.0mm · 0.78mm/px · z∈[-118,+90]mm · 9 of 35 slices shown (2 of 2)]
[im 1/35]
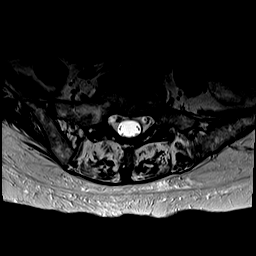
[im 6/35]
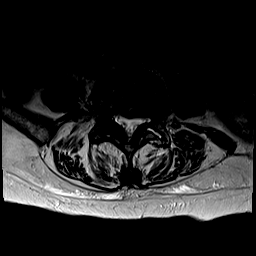
[im 12/35]
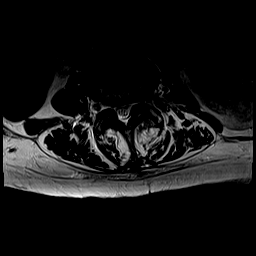
[im 15/35]
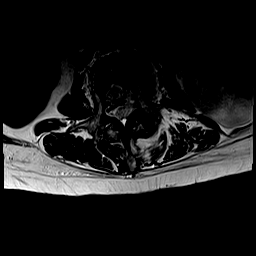
[im 18/35]
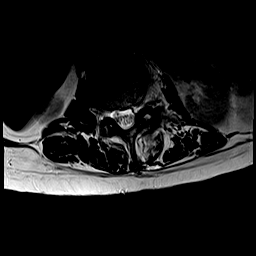
[im 20/35]
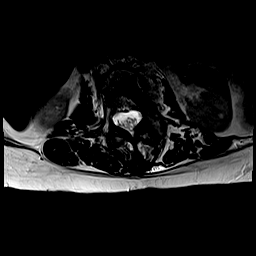
[im 23/35]
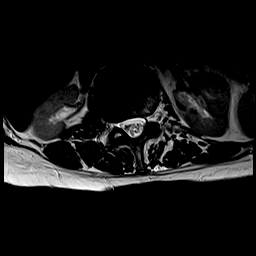
[im 29/35]
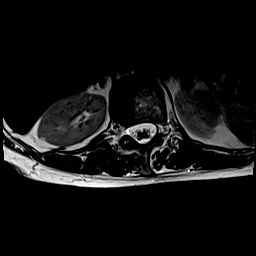
[im 35/35]
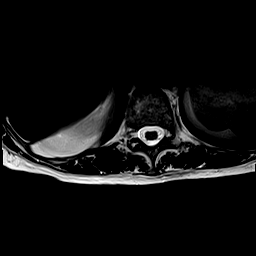

[Series 9: T1 · axial · 4.0mm · 0.39mm/px · z∈[-118,+90]mm · 9 of 35 slices shown (2 of 2)]
[im 1/35]
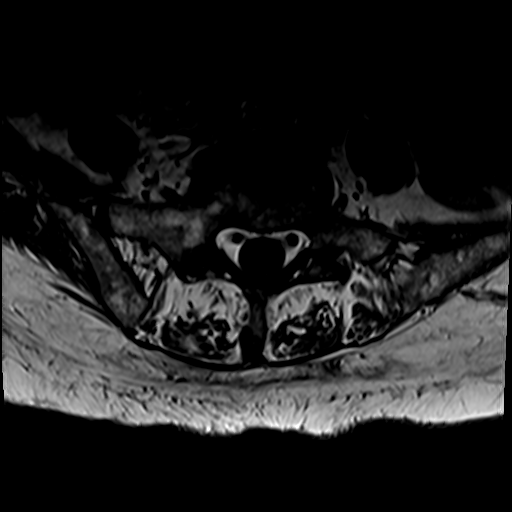
[im 6/35]
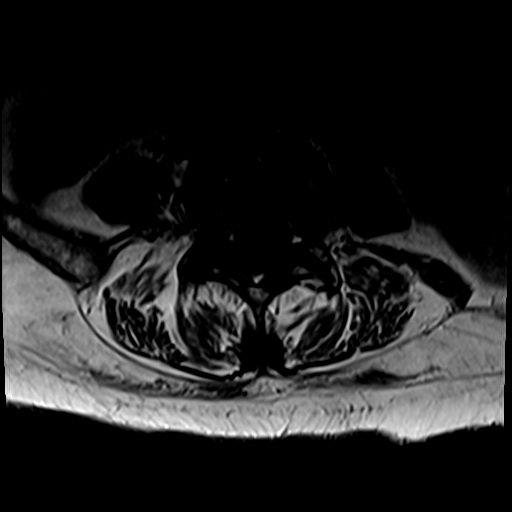
[im 12/35]
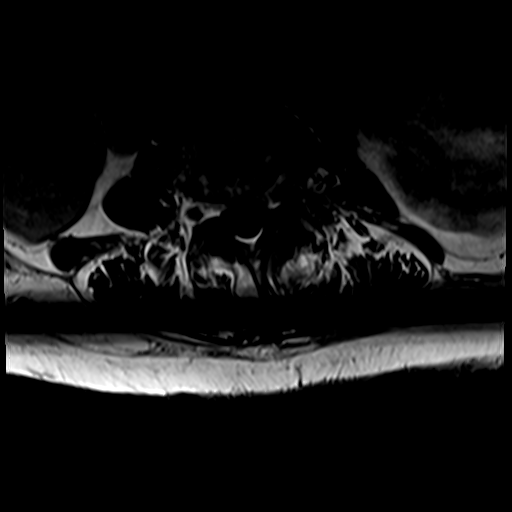
[im 15/35]
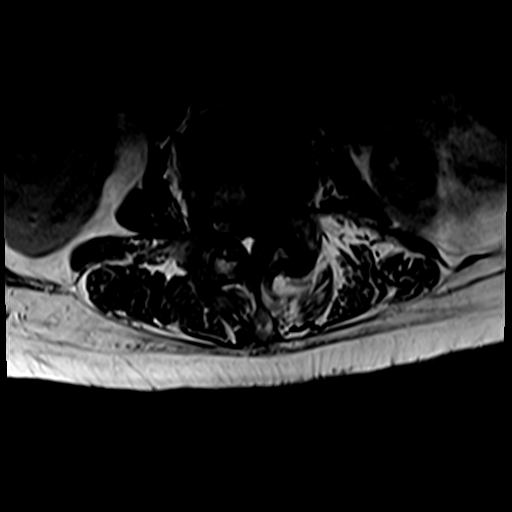
[im 18/35]
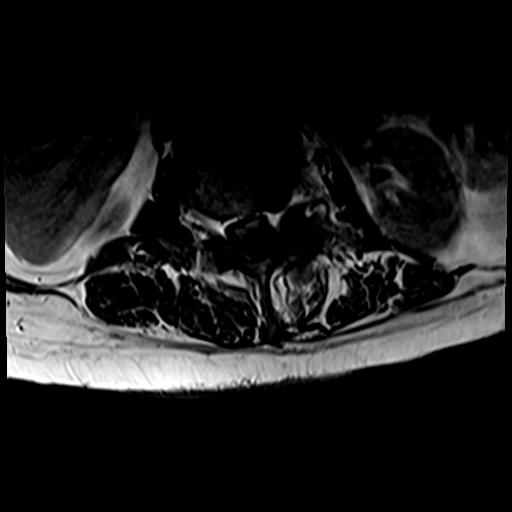
[im 20/35]
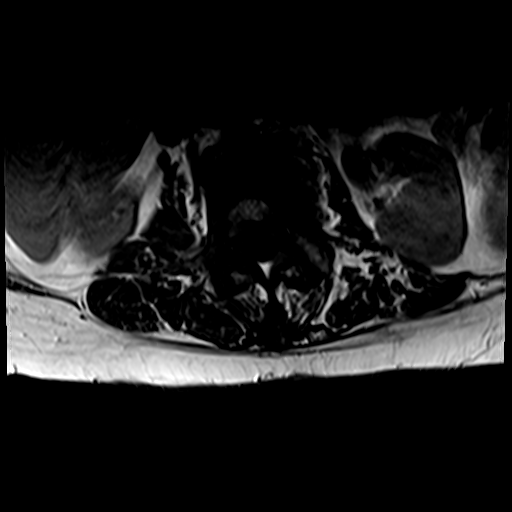
[im 23/35]
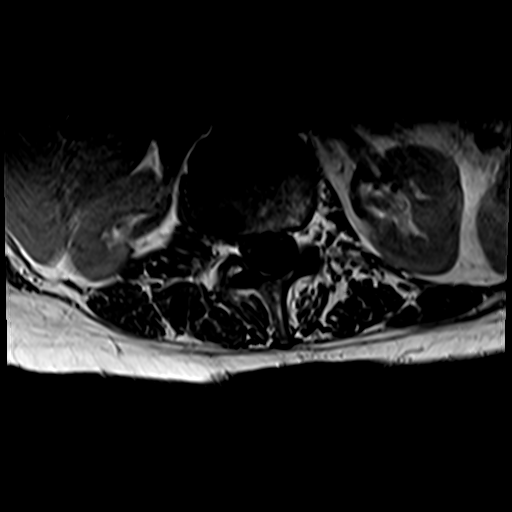
[im 29/35]
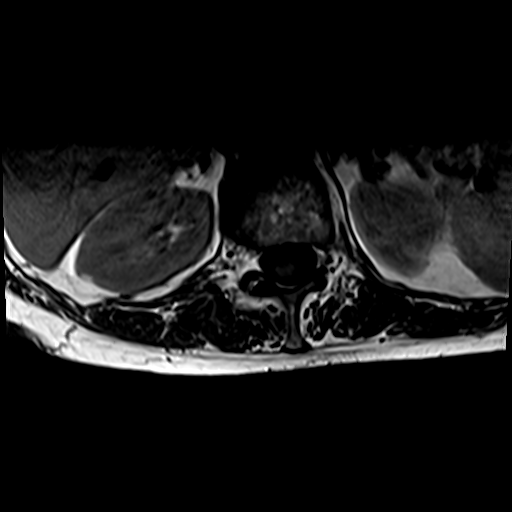
[im 35/35]
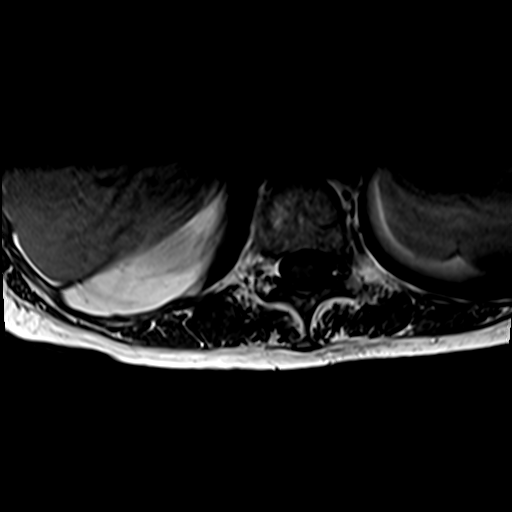

[33 of 48 positions shown; findings below may reference images not displayed]

FINDINGS: Segmentation:  Normal

Alignment: Mild retrolisthesis L1-2 and L2-3. 4 mm anterolisthesis
L3-4.

Vertebrae: Vertebroplasty cement in the vertebral bodies of L2 and
L3. No significant loss of height. No acute fracture in the lumbar
spine. Sacrum negative for fracture.

Conus medullaris and cauda equina: Conus extends to the L1-2 level.
Conus and cauda equina appear normal.

Paraspinal and other soft tissues: Negative for paraspinous mass or
edema. No fluid collection or hematoma.

Disc levels:

T12-L1: Disc degeneration without stenosis

L1-2: Disc bulging and mild facet degeneration. Mild subarticular
stenosis bilaterally

L2-3: Diffuse bulging of the disc with endplate spurring. Bilateral
facet hypertrophy. Severe spinal stenosis. Severe subarticular and
foraminal stenosis on the left. Moderate subarticular stenosis on
the right.

L3-4: 4 mm anterolisthesis. Disc and facet degeneration. Moderate
spinal stenosis. Mild subarticular stenosis on the right. Moderate
subarticular and foraminal stenosis on the left

L4-5: Disc degeneration with disc bulging and spurring. Bilateral
facet degeneration. Moderate subarticular and foraminal stenosis on
the right. Mild spinal stenosis

L5-S1: Disc degeneration and facet degeneration. Mild subarticular
stenosis on the right.
IMPRESSION: 1. Recent kyphoplasty L2 and L3 vertebral bodies. Cement in good
position. No evidence of fracture
2. Severe spinal stenosis L2-3 with disc and facet degeneration.
Severe subarticular and foraminal stenosis on the left and moderate
subarticular stenosis on the right
3. Moderate spinal stenosis L3-4 with mild subarticular stenosis on
the right and moderate subarticular and foraminal stenosis on the
left
4. Moderate subarticular foraminal stenosis on the right at L4-5
with mild spinal stenosis.

## 2021-09-09 ENCOUNTER — Other Ambulatory Visit: Payer: Self-pay

## 2021-09-09 ENCOUNTER — Emergency Department
Admission: EM | Admit: 2021-09-09 | Discharge: 2021-09-09 | Discharge status: Against Medical Advice | Payer: Medicare Other | Attending: Emergency Medicine | Admitting: Emergency Medicine

## 2021-09-09 ENCOUNTER — Encounter: Payer: Self-pay | Admitting: Emergency Medicine

## 2021-09-09 DIAGNOSIS — Z5321 Procedure and treatment not carried out due to patient leaving prior to being seen by health care provider: Secondary | ICD-10-CM | POA: Diagnosis not present

## 2021-09-09 DIAGNOSIS — Y92007 Garden or yard of unspecified non-institutional (private) residence as the place of occurrence of the external cause: Secondary | ICD-10-CM | POA: Insufficient documentation

## 2021-09-09 DIAGNOSIS — T63444A Toxic effect of venom of bees, undetermined, initial encounter: Secondary | ICD-10-CM | POA: Diagnosis not present

## 2021-09-09 MED ORDER — IBUPROFEN 400 MG PO TABS
400.0000 mg | ORAL_TABLET | Freq: Once | ORAL | Status: AC
  Administered 2021-09-09: 400 mg via ORAL
  Filled 2021-09-09: qty 1

## 2021-09-09 MED ORDER — HYDROCORTISONE 1 % EX OINT
TOPICAL_OINTMENT | Freq: Two times a day (BID) | CUTANEOUS | Status: DC
  Administered 2021-09-09: 1 via TOPICAL
  Filled 2021-09-09: qty 28.35

## 2021-09-09 MED ORDER — ACETAMINOPHEN 500 MG PO TABS
1000.0000 mg | ORAL_TABLET | Freq: Once | ORAL | Status: AC
  Administered 2021-09-09: 1000 mg via ORAL
  Filled 2021-09-09: qty 2

## 2021-09-09 NOTE — ED Triage Notes (Signed)
Pt reports she was working in the yard and got stung by bees about 12 times. Pt is hard of hearing forgot her hearing aids. Pt talks in complete sentences.  Pt talks in complete sentences

## 2021-09-09 NOTE — ED Notes (Signed)
Pt daughter to desk, states her mom feels much better, no worsening swelling or concerns and no trouble breathing.  Patient wants to leave, was encouraged to stay.  Daughter states will return if anything worsens.

## 2021-09-09 NOTE — ED Notes (Signed)
Pt reports pain to left index finger and let upper arm, right leg localized redness and mild swelling noted

## 2022-03-25 ENCOUNTER — Encounter: Payer: Self-pay | Admitting: Internal Medicine

## 2022-03-25 ENCOUNTER — Other Ambulatory Visit: Payer: Self-pay

## 2022-03-25 ENCOUNTER — Emergency Department: Payer: Medicare Other

## 2022-03-25 ENCOUNTER — Inpatient Hospital Stay
Admission: EM | Admit: 2022-03-25 | Discharge: 2022-03-31 | DRG: 186 | Disposition: A | Discharge status: Home Health | Payer: Medicare Other | Attending: Internal Medicine | Admitting: Internal Medicine

## 2022-03-25 DIAGNOSIS — I083 Combined rheumatic disorders of mitral, aortic and tricuspid valves: Secondary | ICD-10-CM | POA: Diagnosis present

## 2022-03-25 DIAGNOSIS — I951 Orthostatic hypotension: Secondary | ICD-10-CM | POA: Diagnosis present

## 2022-03-25 DIAGNOSIS — I159 Secondary hypertension, unspecified: Secondary | ICD-10-CM | POA: Diagnosis not present

## 2022-03-25 DIAGNOSIS — M549 Dorsalgia, unspecified: Secondary | ICD-10-CM | POA: Diagnosis present

## 2022-03-25 DIAGNOSIS — D61818 Other pancytopenia: Secondary | ICD-10-CM | POA: Diagnosis present

## 2022-03-25 DIAGNOSIS — M170 Bilateral primary osteoarthritis of knee: Secondary | ICD-10-CM | POA: Diagnosis present

## 2022-03-25 DIAGNOSIS — Z7189 Other specified counseling: Secondary | ICD-10-CM | POA: Diagnosis not present

## 2022-03-25 DIAGNOSIS — E785 Hyperlipidemia, unspecified: Secondary | ICD-10-CM | POA: Diagnosis present

## 2022-03-25 DIAGNOSIS — I110 Hypertensive heart disease with heart failure: Secondary | ICD-10-CM | POA: Diagnosis present

## 2022-03-25 DIAGNOSIS — Z880 Allergy status to penicillin: Secondary | ICD-10-CM

## 2022-03-25 DIAGNOSIS — I5031 Acute diastolic (congestive) heart failure: Secondary | ICD-10-CM | POA: Diagnosis not present

## 2022-03-25 DIAGNOSIS — Z87891 Personal history of nicotine dependence: Secondary | ICD-10-CM | POA: Diagnosis not present

## 2022-03-25 DIAGNOSIS — J9601 Acute respiratory failure with hypoxia: Secondary | ICD-10-CM

## 2022-03-25 DIAGNOSIS — Z888 Allergy status to other drugs, medicaments and biological substances status: Secondary | ICD-10-CM

## 2022-03-25 DIAGNOSIS — I5033 Acute on chronic diastolic (congestive) heart failure: Secondary | ICD-10-CM | POA: Diagnosis present

## 2022-03-25 DIAGNOSIS — H9190 Unspecified hearing loss, unspecified ear: Secondary | ICD-10-CM | POA: Diagnosis present

## 2022-03-25 DIAGNOSIS — Z8542 Personal history of malignant neoplasm of other parts of uterus: Secondary | ICD-10-CM | POA: Diagnosis not present

## 2022-03-25 DIAGNOSIS — K219 Gastro-esophageal reflux disease without esophagitis: Secondary | ICD-10-CM | POA: Diagnosis present

## 2022-03-25 DIAGNOSIS — Z90710 Acquired absence of both cervix and uterus: Secondary | ICD-10-CM | POA: Diagnosis not present

## 2022-03-25 DIAGNOSIS — G8929 Other chronic pain: Secondary | ICD-10-CM | POA: Diagnosis present

## 2022-03-25 DIAGNOSIS — Z9012 Acquired absence of left breast and nipple: Secondary | ICD-10-CM | POA: Diagnosis not present

## 2022-03-25 DIAGNOSIS — Z66 Do not resuscitate: Secondary | ICD-10-CM | POA: Diagnosis present

## 2022-03-25 DIAGNOSIS — Z9049 Acquired absence of other specified parts of digestive tract: Secondary | ICD-10-CM | POA: Diagnosis not present

## 2022-03-25 DIAGNOSIS — Z853 Personal history of malignant neoplasm of breast: Secondary | ICD-10-CM

## 2022-03-25 DIAGNOSIS — Z789 Other specified health status: Secondary | ICD-10-CM | POA: Insufficient documentation

## 2022-03-25 DIAGNOSIS — I509 Heart failure, unspecified: Secondary | ICD-10-CM

## 2022-03-25 DIAGNOSIS — Z9889 Other specified postprocedural states: Secondary | ICD-10-CM

## 2022-03-25 DIAGNOSIS — Z79899 Other long term (current) drug therapy: Secondary | ICD-10-CM | POA: Diagnosis not present

## 2022-03-25 DIAGNOSIS — I2489 Other forms of acute ischemic heart disease: Secondary | ICD-10-CM | POA: Diagnosis present

## 2022-03-25 DIAGNOSIS — J90 Pleural effusion, not elsewhere classified: Principal | ICD-10-CM | POA: Diagnosis present

## 2022-03-25 DIAGNOSIS — R0602 Shortness of breath: Principal | ICD-10-CM | POA: Diagnosis present

## 2022-03-25 DIAGNOSIS — R7989 Other specified abnormal findings of blood chemistry: Secondary | ICD-10-CM

## 2022-03-25 DIAGNOSIS — I10 Essential (primary) hypertension: Secondary | ICD-10-CM | POA: Diagnosis present

## 2022-03-25 HISTORY — DX: Osteoarthritis of knee, unspecified: M17.9

## 2022-03-25 HISTORY — DX: Spinal stenosis, site unspecified: M48.00

## 2022-03-25 HISTORY — DX: Gastro-esophageal reflux disease without esophagitis: K21.9

## 2022-03-25 LAB — CBC
HCT: 30.4 % — ABNORMAL LOW (ref 36.0–46.0)
Hemoglobin: 9.8 g/dL — ABNORMAL LOW (ref 12.0–15.0)
MCH: 32.2 pg (ref 26.0–34.0)
MCHC: 32.2 g/dL (ref 30.0–36.0)
MCV: 100 fL (ref 80.0–100.0)
Platelets: 112 10*3/uL — ABNORMAL LOW (ref 150–400)
RBC: 3.04 MIL/uL — ABNORMAL LOW (ref 3.87–5.11)
RDW: 16.7 % — ABNORMAL HIGH (ref 11.5–15.5)
WBC: 3.5 10*3/uL — ABNORMAL LOW (ref 4.0–10.5)
nRBC: 0 % (ref 0.0–0.2)

## 2022-03-25 LAB — BASIC METABOLIC PANEL
Anion gap: 7 (ref 5–15)
BUN: 21 mg/dL (ref 8–23)
CO2: 26 mmol/L (ref 22–32)
Calcium: 9 mg/dL (ref 8.9–10.3)
Chloride: 106 mmol/L (ref 98–111)
Creatinine, Ser: 0.89 mg/dL (ref 0.44–1.00)
GFR, Estimated: >60 mL/min (ref >60)
Glucose, Bld: 111 mg/dL — ABNORMAL HIGH (ref 70–99)
Potassium: 4.1 mmol/L (ref 3.5–5.1)
Sodium: 139 mmol/L (ref 135–145)

## 2022-03-25 LAB — LACTIC ACID, PLASMA
Lactic Acid, Venous: 1.4 mmol/L (ref 0.5–1.9)
Lactic Acid, Venous: 1.5 mmol/L (ref 0.5–1.9)

## 2022-03-25 LAB — TROPONIN I (HIGH SENSITIVITY)
Troponin I (High Sensitivity): 32 ng/L — ABNORMAL HIGH (ref <18)
Troponin I (High Sensitivity): 32 ng/L — ABNORMAL HIGH (ref <18)

## 2022-03-25 LAB — BRAIN NATRIURETIC PEPTIDE: B Natriuretic Peptide: 2271.6 pg/mL — ABNORMAL HIGH (ref 0.0–100.0)

## 2022-03-25 MED ORDER — SODIUM CHLORIDE 0.9% FLUSH
3.0000 mL | Freq: Two times a day (BID) | INTRAVENOUS | Status: DC
  Administered 2022-03-25: 3 mL via INTRAVENOUS

## 2022-03-25 MED ORDER — ACETAMINOPHEN 650 MG RE SUPP: 650.0000 mg | Freq: Four times a day (QID) | RECTAL | Status: DC | PRN

## 2022-03-25 MED ORDER — LIDOCAINE-PRILOCAINE 2.5-2.5 % EX CREA
TOPICAL_CREAM | Freq: Once | CUTANEOUS | Status: AC
Start: 2022-03-25 — End: 2022-03-25
  Filled 2022-03-25: qty 5

## 2022-03-25 MED ORDER — HEPARIN SODIUM (PORCINE) 5000 UNIT/ML IJ SOLN
5000.0000 [IU] | Freq: Three times a day (TID) | INTRAMUSCULAR | Status: DC
  Administered 2022-03-25 – 2022-03-31 (×17): 5000 [IU] via SUBCUTANEOUS
  Filled 2022-03-25 (×18): qty 1

## 2022-03-25 MED ORDER — IBUPROFEN-ACETAMINOPHEN 125-250 MG PO TABS: 2.0000 | ORAL_TABLET | Freq: Three times a day (TID) | ORAL | Status: DC | PRN

## 2022-03-25 MED ORDER — SODIUM CHLORIDE 0.9% FLUSH: 3.0000 mL | INTRAVENOUS | Status: DC | PRN

## 2022-03-25 MED ORDER — VANCOMYCIN HCL IN DEXTROSE 1-5 GM/200ML-% IV SOLN
1000.0000 mg | Freq: Once | INTRAVENOUS | Status: AC
Start: 2022-03-25 — End: 2022-03-25
  Administered 2022-03-25: 1000 mg via INTRAVENOUS
  Filled 2022-03-25: qty 200

## 2022-03-25 MED ORDER — FUROSEMIDE 10 MG/ML IJ SOLN
40.0000 mg | Freq: Two times a day (BID) | INTRAMUSCULAR | Status: AC
  Administered 2022-03-26 (×2): 40 mg via INTRAVENOUS
  Filled 2022-03-25 (×3): qty 4

## 2022-03-25 MED ORDER — FUROSEMIDE 10 MG/ML IJ SOLN
20.0000 mg | Freq: Once | INTRAMUSCULAR | Status: AC
  Administered 2022-03-25: 20 mg via INTRAVENOUS
  Filled 2022-03-25: qty 4

## 2022-03-25 MED ORDER — POLYVINYL ALCOHOL 1.4 % OP SOLN
1.0000 [drp] | Freq: Every day | OPHTHALMIC | Status: DC
  Administered 2022-03-26 – 2022-03-31 (×6): 1 [drp] via OPHTHALMIC
  Filled 2022-03-25 (×2): qty 15

## 2022-03-25 MED ORDER — IOHEXOL 350 MG/ML SOLN
75.0000 mL | Freq: Once | INTRAVENOUS | Status: AC | PRN
  Administered 2022-03-25: 75 mL via INTRAVENOUS

## 2022-03-25 MED ORDER — ACETAMINOPHEN 325 MG PO TABS: 650.0000 mg | ORAL_TABLET | Freq: Four times a day (QID) | ORAL | Status: DC | PRN

## 2022-03-25 MED ORDER — SODIUM CHLORIDE 0.9 % IV SOLN: 250.0000 mL | INTRAVENOUS | Status: DC | PRN

## 2022-03-25 MED ORDER — SODIUM CHLORIDE 0.9 % IV SOLN
250.0000 mL | INTRAVENOUS | Status: DC | PRN
  Administered 2022-03-26: 250 mL via INTRAVENOUS

## 2022-03-25 MED ORDER — HYDROCHLOROTHIAZIDE 12.5 MG PO TABS: 12.5000 mg | ORAL_TABLET | Freq: Every day | ORAL | Status: DC | PRN

## 2022-03-25 MED ORDER — METOPROLOL TARTRATE 25 MG PO TABS
25.0000 mg | ORAL_TABLET | Freq: Two times a day (BID) | ORAL | Status: DC
  Administered 2022-03-25 – 2022-03-31 (×11): 25 mg via ORAL
  Filled 2022-03-25 (×11): qty 1

## 2022-03-25 MED ORDER — LOSARTAN POTASSIUM 25 MG PO TABS
25.0000 mg | ORAL_TABLET | Freq: Every day | ORAL | Status: DC
  Administered 2022-03-25 – 2022-03-31 (×7): 25 mg via ORAL
  Filled 2022-03-25 (×7): qty 1

## 2022-03-25 MED ORDER — SENNA 8.6 MG PO TABS
1.0000 | ORAL_TABLET | Freq: Two times a day (BID) | ORAL | Status: DC
  Administered 2022-03-25 – 2022-03-30 (×10): 8.6 mg via ORAL
  Filled 2022-03-25 (×10): qty 1

## 2022-03-25 MED ORDER — LEVOFLOXACIN IN D5W 750 MG/150ML IV SOLN
750.0000 mg | Freq: Once | INTRAVENOUS | Status: AC
  Administered 2022-03-25: 750 mg via INTRAVENOUS
  Filled 2022-03-25: qty 150

## 2022-03-25 NOTE — ED Triage Notes (Signed)
Pt via POV from home. Pt c/o SOB and bilateral leg swelling. Denies hx of CHF. Denies cough. States the SOB and weakness and has been going on for the past couple of days. States only medication she takes is metoprolol. Pt is A&OX4 and NAD

## 2022-03-25 NOTE — Assessment & Plan Note (Signed)
Discussed with patient with daughter present. She was very clear about not wanting cardio-pulmonary resuscitation in the event of cardiac arrest or respiratory arrest.   Plan DNR/DNI

## 2022-03-25 NOTE — Subjective & Objective (Signed)
Brandi Reid, a 89 y/o independent woman with h/o breast cancer and uterine cancer remotely reports progressive SOB/DOE and increasing weakness over the past month or more. Her last OV PCP 01/31/22 she was stable and doing well. Other medical problems include HTN, OA knees, GERD, chronic anemia, spinal stenosis, h/o lumbar compression fracture s/p kyphoplasty, chronic back pain with failed implanted stimulator. She now presents due to severe SOB and weakness. She denies fever, chills, productive cough, chest pain, any GI symptoms.

## 2022-03-25 NOTE — Assessment & Plan Note (Addendum)
Patient w/o evidence of infection/PNA. CXR and CTA reveal pleural loculated pleural effusions bilaterally, changes that could represent infection vs pulmonary edema, nodularity which may suggest malignancy. She has new onset CHF with pulmonary edema. She has a h/o smoking in the past,  breast cancer and uterine cancer raising possibility of malignant effusion. She is not in respiratory distress or respiratory failure. Dr. Vella Kohler was consulted by EDP.  Plan IR to do thoracentesis for diagnostic purposes: transudate vs exudate  Further plans to be based on pathology report.

## 2022-03-25 NOTE — ED Provider Notes (Signed)
[facility] Provider Note    Event Date/Time   First MD Initiated Contact with Patient 03/25/22 1433     (approximate)   History   Shortness of Breath   HPI  Brandi Reid is a 89 y.o. female past medical history significant for hypertension, CKD, hyperlipidemia, spinal stenosis who presents to the emergency department shortness of breath.  Endorses shortness of breath that has been progressively worsening over the past 2 days.  Feels like her shortness of breath is worse when laying down or with minimal exertion.  Denies any chest pain.  States that she does not take any Lasix at baseline.  Followed by cardiology.  Denies any nausea vomiting or diarrhea.  Denies any abdominal pain.  No recent falls or trauma.  Remote history of breast cancer approximately 20 years ago.  No prior history of DVT or PE.     Physical Exam   Triage Vital Signs: ED Triage Vitals  Enc Vitals Group     BP 03/25/22 1244 (!) 187/84     Pulse Rate 03/25/22 1244 75     Resp 03/25/22 1244 20     Temp 03/25/22 1254 98.5 F (36.9 C)     Temp Source 03/25/22 1254 Oral     SpO2 03/25/22 1244 100 %     Weight 03/25/22 1241 125 lb (56.7 kg)     Height 03/25/22 1241 '5\' 4"'$  (1.626 m)     Head Circumference --      Peak Flow --      Pain Score 03/25/22 1241 5     Pain Loc --      Pain Edu? --      Excl. in [place]? --     Most recent vital signs: Vitals:   03/25/22 1254 03/25/22 1556  BP:  (!) 167/93  Pulse:  82  Resp:  18  Temp: 98.5 F (36.9 C) 98.1 F (36.7 C)  SpO2:  90%    Physical Exam Constitutional:      Appearance: She is well-developed.     Comments: Daughter at bedside  HENT:     Head: Atraumatic.  Eyes:     Conjunctiva/sclera: Conjunctivae normal.  Cardiovascular:     Rate and Rhythm: Regular rhythm.  Pulmonary:     Breath sounds: Rales present.     Comments: Speaking in full sentences.  Crackles to bilateral lower lung fields. Abdominal:      General: There is no distension.  Musculoskeletal:        General: Normal range of motion.     Cervical back: Normal range of motion.     Right lower leg: Edema present.     Left lower leg: Edema present.     Comments: Pitting edema to bilateral lower extremities that is worse in the right lower extremity.  Skin:    General: Skin is warm.     Capillary Refill: Capillary refill takes less than 2 seconds.  Neurological:     Mental Status: She is alert. Mental status is at baseline.     IMPRESSION / MDM / ASSESSMENT AND PLAN / ED COURSE  I reviewed the triage vital signs and the nursing notes.  Differential diagnosis including ACS, heart failure, pneumonia, pulmonary embolism, pericarditis, malignancy  EKG  I, Nathaniel Man, the attending physician, personally viewed and interpreted this ECG.   Rate: Normal  Rhythm: Normal sinus  Axis: Normal  Intervals: Normal  ST&T Change: None  No tachycardic or  bradycardic dysrhythmias while on cardiac telemetry.  RADIOLOGY I independently reviewed imaging, my interpretation of imaging: Chest x-ray with questionable findings consistent with pneumonia, appears atypical, they called and discussed the results with Dr. Joni Fears.  On my evaluation questionable area of pneumonia versus an infarct.  Will obtain CTA to further evaluate and to evaluate for pulmonary embolism.  LABS (all labs ordered are listed, but only abnormal results are displayed) Labs interpreted as -    Labs Reviewed  BASIC METABOLIC PANEL - Abnormal; Notable for the following components:      Result Value   Glucose, Bld 111 (*)    All other components within normal limits  CBC - Abnormal; Notable for the following components:   WBC 3.5 (*)    RBC 3.04 (*)    Hemoglobin 9.8 (*)    HCT 30.4 (*)    RDW 16.7 (*)    Platelets 112 (*)    All other components within normal limits  BRAIN NATRIURETIC PEPTIDE - Abnormal; Notable for the following components:   B Natriuretic  Peptide 2,271.6 (*)    All other components within normal limits  TROPONIN I (HIGH SENSITIVITY) - Abnormal; Notable for the following components:   Troponin I (High Sensitivity) 32 (*)    All other components within normal limits  CULTURE, BLOOD (ROUTINE X 2)  CULTURE, BLOOD (ROUTINE X 2)  LACTIC ACID, PLASMA  LACTIC ACID, PLASMA  TROPONIN I (HIGH SENSITIVITY)    TREATMENT    MDM  Given chest x-ray with questionable findings consistent with pneumonia added on blood cultures and a lactic acid.  Patient otherwise does not have any symptoms of pneumonia given that she does not have any fever or cough.  On chart review no history of heart failure and I do not see that she has had a prior echocardiogram in the past.  Does not take home Lasix and I did not see home anticoagulation.  Anemia in the emergency department and has platelets of 112 but no signs or symptoms of GI bleed.  Troponin is elevated at 32 with a BNP elevated in the 2000's.  Patient will need admitted to the hospitalist after further evaluation with pulmonary embolism workup.  Care transferred to oncoming physician.     PROCEDURES:  Critical Care performed: yes  .Critical Care  Performed by: Nathaniel Man, MD Authorized by: Nathaniel Man, MD   Critical care provider statement:    Critical care time (minutes):  30   Critical care time was exclusive of:  Separately billable procedures and treating other patients   Critical care was necessary to treat or prevent imminent or life-threatening deterioration of the following conditions:  Cardiac failure   Critical care was time spent personally by me on the following activities:  Development of treatment plan with patient or surrogate, discussions with consultants, evaluation of patient's response to treatment, examination of patient, ordering and review of laboratory studies, ordering and review of radiographic studies, ordering and performing treatments and  interventions, pulse oximetry, re-evaluation of patient's condition and review of old charts   Patient's presentation is most consistent with acute presentation with potential threat to life or bodily function.   MEDICATIONS ORDERED IN ED: Medications  lidocaine-prilocaine (EMLA) cream ( Topical Given 03/25/22 1504)    FINAL CLINICAL IMPRESSION(S) / ED DIAGNOSES   Final diagnoses:  SOB (shortness of breath)     Rx / DC Orders   ED Discharge Orders     None  Note:  This document was prepared using Dragon voice recognition software and may include unintentional dictation errors.   Nathaniel Man, MD 03/25/22 [phone]

## 2022-03-25 NOTE — ED Notes (Signed)
Report given to Levester Fresh, RN

## 2022-03-25 NOTE — Assessment & Plan Note (Signed)
Patient has developed HTN in her later years. She has been well controlled on BB  Plan Continue BB  Additional meds for CHF   Close monitoring of BP on new regimen

## 2022-03-25 NOTE — ED Provider Notes (Signed)
4:37 PM Assumed care for off going team.   Blood pressure (!) 167/93, pulse 82, temperature 98.1 F (36.7 C), temperature source Oral, resp. rate 18, height '5\' 4"'$  (1.626 m), weight 56.7 kg, SpO2 90 %.  See their HPI for full report but in brief pending CT and admit.    IMPRESSION: 1. No sign of venous thromboembolism in the chest. 2. 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 3. Loculated pleural fluid throughout the chest with scalloped margins. No discrete pleural nodule is visible on this very early phase examination. Given patient history of neoplasm this raises concern for malignant effusions. Effusions related to heart failure or volume overload would not be expected to have this loculated appearance. Post infectious pleural fluid would likely have greater enhancement. When viewed in the sagittal plane in the RIGHT chest there is frank nodularity though measuring lower density than expected for soft tissue along the major and minor fissures. 4. Patchy areas of ground-glass and septal thickening at the lung bases along with tree-in-bud nodularity could reflect mild pneumonitis. Would also correlate with signs of heart failure. In the current context follow-up will be helpful to ensure no signs of lymphangitic tumor. 5. LEFT basilar consolidation and patchy areas of tree in bud. Findings are concerning for pneumonia. 6. Soft tissue thickening over the RIGHT neck with signs of nodularity overlying the sternocleidomastoid muscle. Much of this could be due to presence of venous collaterals about the RIGHT neck and upper chest though this is not clear. Follow-up ultrasound may be helpful for further evaluation of this area. 7. Small nodules at the LEFT lung base measuring 4 mm respectively. Nonspecific findings, suggest attention on follow-up. 8. Aortic atherosclerosis.   Patient  was placed on 2 L due to some slight desaturations.  Patient given a small dose of Lasix due to possible CHF.  Exam of the right neck she is got some prominent lymph nodes but no obvious mass noted.  Patient covered broadly with Vanco and levofloxacin given her penicillin severe allergy do not see that she is previously gotten cephalosporin.  Will hold off on fluids given the concern for fluid overloaded as well.  Given the given the loculated pleural effusion I will discuss with pulmonary to see if these would potentially need chest tubes.   .Critical Care  Performed by: Vanessa [place], MD Authorized by: Vanessa [place], MD   Critical care provider statement:    Critical care time (minutes):  30   Critical care was necessary to treat or prevent imminent or life-threatening deterioration of the following conditions:  Respiratory failure   Critical care was time spent personally by me on the following activities:  Development of treatment plan with patient or surrogate, discussions with consultants, evaluation of patient's response to treatment, examination of patient, ordering and review of laboratory studies, ordering and review of radiographic studies, ordering and performing treatments and interventions, pulse oximetry, re-evaluation of patient's condition and review of old charts     Discussed with Dr. Fleming-recommended admission to our hospital and they can do an thoracentesis tomorrow if it is an empyema patient would need transferred if she wanted to have a chest tube but if it is malignant then they would avoid doing any chest tubes.  But given white count that similar to her priors no fever there is a higher suspicion for this being malignant in nature.  She is not in  any respiratory distress.  Discussed with family and they are still unsure if they would want to have a chest tube done regardless but they are most likely willing to do the tap for tomorrow at least to know exactly what this  fluid is and they feel comfortable with admission here and understand that if she ends up needing a chest tube she may require transfer but at this time they would like to stay here.       Vanessa [place], MD 03/25/22 [id number]

## 2022-03-25 NOTE — ED Notes (Signed)
Attempted to place IV in the right arm of pt. Pt yelled out in discomfort. Pt's family questioning the necessity of an iv at this time. Educated pt and family member that the purpose of the iv is for future blood draws, and for medication that will be administrated though the area. Pt's family requesting numbing medication prior to iv placement. Pt and family also requesting iv placement in the other arm, which has a pink sleeve due to a previous lymph node removal. Educated pt that it wouldn't be best practice to use the arm that has removal of the lymph nodes. MD Mumma updated on the interaction with pt and family, and has ordered numbing medication for the iv site. Md Mumma at pt bedside at this time.

## 2022-03-25 NOTE — Assessment & Plan Note (Signed)
Patient w/o prior h/o CHF now with elevated BNP, abnormal CXR and CTA suggestive of pulmonary edema, infection unlikely w/o fever, productive cough or leukocytosis. She endorses progressive SOB/DOE over at least the past month along with peripheral edema. She denies rigors, fever at home, any chest pain.   Plan Tele admit  2D echo  Medical therapy: IV lasix q12, continue BB, add ARB  Heart failure protocol

## 2022-03-25 NOTE — ED Notes (Signed)
Pt's oxygen levels keep dropping down between 85-88%, pt placed on 2L nasal cannula.

## 2022-03-25 NOTE — ED Notes (Signed)
Dr. Linda Hedges at bedside

## 2022-03-25 NOTE — H&P (Signed)
History and Physical    Brandi Reid [id number] DOB: 1927/04/02 DOA: 03/25/2022  DOS: the patient was seen and examined on 03/25/2022  PCP: Brandi Lange, NP   Patient coming from: Home  I have personally briefly reviewed patient's old medical records in [facility]  Brandi Reid, a 89 y/o independent woman with h/o breast cancer and uterine cancer remotely reports progressive SOB/DOE and increasing weakness over the past month or more. Her last OV PCP 01/31/22 she was stable and doing well. Other medical problems include HTN, OA knees, GERD, chronic anemia, spinal stenosis, h/o lumbar compression fracture s/p kyphoplasty, chronic back pain with failed implanted stimulator. She now presents due to severe SOB and weakness. She denies fever, chills, productive cough, chest pain, any GI symptoms.   ED Course: T 98  197/89  HR 83  RR 23. No distress on presentation. EDP exam notable for rales, LE edema. Lab - Glucose 111, CBCD with Hgb 9.8 (10.8 most recent) plt 112, no diff done. BNP 2,271.6, Troponin 32 Lactic acid 1.5. EKG w/o acute changes. CXR - bilateral effusions - CHF vs PNA; CTA no PE, incidental finding of kyphoplasty cement embolized to pul aa, loculated plerual effusion thr-out, patchy ground glass opacities and tree-in-bud nodularity. In ED patient received dose of IV lasix. Dr.Flemming for pulmonary consulted by EDP - no indication for emergent chest tube - recommended [facility] admit and arrange of IR to do thoracentesis for diagnostic purposes. [facility] called to admit.  Review of Systems:  Review of Systems  Constitutional:  Positive for malaise/fatigue. Negative for chills, diaphoresis, fever and weight loss.  HENT:  Positive for hearing loss. Negative for congestion.   Eyes: Negative.   Respiratory:  Positive for shortness of breath. Negative for cough, sputum production and wheezing.   Cardiovascular:  Positive for leg swelling. Negative for chest pain and  palpitations.  Gastrointestinal:  Negative for heartburn, nausea and vomiting.  Genitourinary: Negative.   Musculoskeletal: Negative.   Skin: Negative.   Neurological:  Positive for weakness and headaches.  Endo/Heme/Allergies: Negative.   Psychiatric/Behavioral: Negative.      Past Medical History:  Diagnosis Date   Anemia    Breast cancer ([place]) 2005   left positive   Cancer ([place]) 2005   lt lumpectomy/chemo and rad   Complication of anesthesia    pt states she broke out in a sweat and felt like she was going to pass out during a dental procedure-no problems with surgeries though   GERD (gastroesophageal reflux disease)    History of hiatal hernia    Hypertension    Osteoarthritis, knee    Spinal stenosis    Uterine cancer ([place])     Past Surgical History:  Procedure Laterality Date   ABDOMINAL HYSTERECTOMY     APPENDECTOMY     age 88   BREAST BIOPSY Right 1980   neg   BREAST CYST EXCISION Right    BREAST LUMPECTOMY     CHOLECYSTECTOMY     KYPHOPLASTY N/A 10/30/2018   Procedure: L2 , L3 KYPHOPLASTY;  Surgeon: Hessie Knows, MD;  Location: [facility] ORS;  Service: Orthopedics;  Laterality: N/A;    Social Hx - widowed for many years. She had 3 children and one adopted child who is deceased. 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 She lives alone and is I-ADLs. Daughter lives close by. Son at a distance but checks on her regularly.    reports that she has never smoked. She  has never used smokeless tobacco. She reports that she does not drink alcohol and does not use drugs.  Allergies  Allergen Reactions   Compazine [Prochlorperazine] Anaphylaxis   Penicillins Anaphylaxis    Did it involve swelling of the face/tongue/throat, SOB, or low BP? Yes Did it involve sudden or severe rash/hives, skin peeling, or any reaction on the inside of your mouth or nose? No Did you need to seek medical attention at a hospital or doctor's office? Yes When did it last happen? More  than 30-40 years ago If all above answers are "NO", may proceed with cephalosporin use.     Family History  Problem Relation Age of Onset   Breast cancer Daughter 69    Prior to Admission medications   Medication Sig Start Date End Date Taking? Authorizing Provider  ADVIL DUAL ACTION 125-250 MG TABS Take 2 tablets by mouth every 8 (eight) hours as needed (pain.).   Yes [provider]  cholecalciferol (VITAMIN D) 25 MCG (1000 UT) tablet Take 1,000 Units by mouth every evening.   Yes [provider]  hydrochlorothiazide (HYDRODIURIL) 25 MG tablet Take 12.5 mg by mouth daily as needed.   Yes [provider]  metoprolol tartrate (LOPRESSOR) 50 MG tablet Take 25 mg by mouth 2 (two) times daily.   Yes [provider]  Multiple Vitamins-Minerals (PRESERVISION AREDS 2 PO) Take 1 tablet by mouth 2 (two) times daily.   Yes [provider]  Polyethyl Glycol-Propyl Glycol (LUBRICANT EYE DROPS) 0.4-0.3 % SOLN Place 1 drop into both eyes daily.   Yes [provider]  HYDROcodone-acetaminophen (NORCO) 5-325 MG tablet Take 1 tablet by mouth every 6 (six) hours as needed. Patient not taking: Reported on 03/25/2022 10/30/18   Hessie Knows, MD    Physical Exam: Vitals:   03/25/22 1917 03/25/22 1930 03/25/22 2000 03/25/22 2030  BP: (!) 205/91 (!) 204/88 (!) 197/89 (!) 196/92  Pulse: 89 86 86 83  Resp: (!) 24 (!) 24 (!) 23 (!) 24  Temp: 98 F (36.7 C)     TempSrc: Oral     SpO2: 95% 95% 97% 100%  Weight:      Height:       Exam limited by patient being in hallway with privacy curtains.  Physical Exam Vitals and nursing note reviewed.  Constitutional:      Comments: Very elderly and frail appearing. In no distress. Very awake and alert  HENT:     Head: Normocephalic and atraumatic.     Mouth/Throat:     Mouth: Mucous membranes are moist.  Eyes:     Extraocular Movements: Extraocular movements intact.     Pupils: Pupils are equal, round,  and reactive to light.  Neck:     Vascular: No JVD.  Cardiovascular:     Rate and Rhythm: Normal rate and regular rhythm.     Pulses: Normal pulses.     Heart sounds: Normal heart sounds.  Pulmonary:     Effort: No tachypnea, accessory muscle usage or respiratory distress.     Breath sounds: Examination of the right-lower field reveals decreased breath sounds and rales. Examination of the left-lower field reveals decreased breath sounds and rales. Decreased breath sounds and rales present. No wheezing or rhonchi.  Chest:     Chest wall: No mass or deformity.  Abdominal:     Palpations: Abdomen is soft.  Musculoskeletal:     Cervical back: Neck supple.     Right lower leg: Edema present.  Left lower leg: Edema present.  Skin:    General: Skin is warm and dry.  Neurological:     Mental Status: She is oriented to person, place, and time.     Cranial Nerves: No cranial nerve deficit.  Psychiatric:        Mood and Affect: Mood normal.        Behavior: Behavior normal.      Labs on Admission: I have personally reviewed following labs and imaging studies  CBC: Recent Labs  Lab 03/25/22 1248  WBC 3.5*  HGB 9.8*  HCT 30.4*  MCV 100.0  PLT [id number]*   Basic Metabolic Panel: Recent Labs  Lab 03/25/22 1248  NA 139  K 4.1  CL 106  CO2 26  GLUCOSE 111*  BUN 21  CREATININE 0.89  CALCIUM 9.0   GFR: Estimated Creatinine Clearance: 34.8 mL/min (by C-G formula based on SCr of 0.89 mg/dL). Liver Function Tests: No results for input(s): "AST", "ALT", "ALKPHOS", "BILITOT", "PROT", "ALBUMIN" in the last 168 hours. No results for input(s): "LIPASE", "AMYLASE" in the last 168 hours. No results for input(s): "AMMONIA" in the last 168 hours. Coagulation Profile: No results for input(s): "INR", "PROTIME" in the last 168 hours. Cardiac Enzymes: No results for input(s): "CKTOTAL", "CKMB", "CKMBINDEX", "TROPONINI" in the last 168 hours. BNP (last 3 results) No results for input(s):  "PROBNP" in the last 8760 hours. HbA1C: No results for input(s): "HGBA1C" in the last 72 hours. CBG: No results for input(s): "GLUCAP" in the last 168 hours. Lipid Profile: No results for input(s): "CHOL", "HDL", "LDLCALC", "TRIG", "CHOLHDL", "LDLDIRECT" in the last 72 hours. Thyroid Function Tests: No results for input(s): "TSH", "T4TOTAL", "FREET4", "T3FREE", "THYROIDAB" in the last 72 hours. Anemia Panel: No results for input(s): "VITAMINB12", "FOLATE", "FERRITIN", "TIBC", "IRON", "RETICCTPCT" in the last 72 hours. Urine analysis:    Component Value Date/Time   COLORURINE Yellow 02/16/2013 2125   APPEARANCEUR Clear 02/16/2013 2125   LABSPEC 1.018 02/16/2013 2125   PHURINE 6.0 02/16/2013 2125   GLUCOSEU Negative 02/16/2013 2125   HGBUR Negative 02/16/2013 2125   BILIRUBINUR Negative 02/16/2013 2125   KETONESUR Trace 02/16/2013 2125   PROTEINUR 30 mg/dL 02/16/2013 2125   NITRITE Negative 02/16/2013 2125   LEUKOCYTESUR 3+ 02/16/2013 2125    Radiological Exams on Admission: I have personally reviewed images CT Angio Chest Pulmonary Embolism (PE) W or WO Contrast  Addendum Date: 03/25/2022   ADDENDUM REPORT: 03/25/2022 18:43 ADDENDUM: Effusion sizes are moderate on the LEFT with a larger sub pulmonic component just over the LEFT hemidiaphragm and small to moderate on the RIGHT with loculated areas as discussed. These results were called by telephone at the time of interpretation on 03/25/2022 at 6:42 pm to provider Dr. Nickolas Madrid, Who verbally acknowledged these results. Electronically Signed   By: Zetta Bills M.D.   On: 03/25/2022 18:43   Result Date: 03/25/2022 CLINICAL DATA:  89 year old female presents for evaluation of suspected pulmonary embolism with shortness of breath and bilateral leg swelling. History of uterine and breast cancer. * Tracking Code: BO * EXAM: CT ANGIOGRAPHY CHEST WITH CONTRAST TECHNIQUE: Multidetector CT imaging of the chest was performed using the standard  protocol during bolus administration of intravenous contrast. Multiplanar CT image reconstructions and MIPs were obtained to evaluate the vascular anatomy. RADIATION DOSE REDUCTION: This exam was performed according to the departmental dose-optimization program which includes automated exposure control, adjustment of the mA and/or kV according to patient size and/or use of iterative reconstruction technique. CONTRAST:  27m OMNIPAQUE IOHEXOL 350 MG/ML SOLN COMPARISON:  None available FINDINGS: Cardiovascular: Calcified aortic atherosclerotic changes. Heart size moderately enlarged. Central pulmonary arteries are opacified to reported 83 Hounsfield units. There is no sign of venous thromboembolism in the chest. Sequela of prior cement embolism present in the RIGHT lower lobe pulmonary artery without occlusive features or signs of surrounding inflammation. Mediastinum/Nodes: Esophagus grossly normal. No discrete adenopathy in the chest. Soft tissue thickening over the RIGHT neck with signs of nodularity overlying the sternocleidomastoid muscle. Much of this could be due to presence of venous collaterals about the RIGHT neck and upper chest though this is not clear. Question of fullness of subcarinal nodal tissue and RIGHT paratracheal nodal tissue but these areas are difficult to assess due to dense contrast in the pulmonary vascular bed and in the SVC. No frank hilar nodal tissue with enlargement. Lungs/Pleura: Loculated pleural fluid throughout the chest with scalloped margins. No discrete pleural nodule is visible on this very early phase examination. Density of pleural fluid is low. Pleural fluid is loculated in the major fissure in the RIGHT chest tracking around the anterior RIGHT chest towards the level of the mid chest and seen dependently tracking posterior to the RIGHT heart and over the RIGHT hemidiaphragm. There is sub pulmonic fluid in the LEFT chest with volume loss in the LEFT lower lobe. Signs of  septal thickening throughout the chest greatest at the lung bases where there is the greatest amount of pleural fluid. Small nodules at the LEFT lung base measuring 4 mm on image 92 and 93 respectively. Airways are patent. LEFT basilar consolidation and patchy areas of tree in bud. Upper Abdomen: Incidental imaging of upper abdominal contents shows no acute process to the extent that can be evaluated on this chest CT based on timing of the bolus and artifact. Musculoskeletal: Spinal nerve stimulator in place. No chest wall mass.  Spinal degenerative changes and osteopenia. Review of the MIP images confirms the above findings. IMPRESSION: 1. No sign of venous thromboembolism in the chest. 2. 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 3. Loculated pleural fluid throughout the chest with scalloped margins. No discrete pleural nodule is visible on this very early phase examination. Given patient history of neoplasm this raises concern for malignant effusions. Effusions related to heart failure or volume overload would not be expected to have this loculated appearance. Post infectious pleural fluid would likely have greater enhancement. When viewed in the sagittal plane in the RIGHT chest there is frank nodularity though measuring lower density than expected for soft tissue along the major and minor fissures. 4. Patchy areas of ground-glass and septal thickening at the lung bases along with tree-in-bud nodularity could reflect mild pneumonitis. Would also correlate with signs of heart failure. In the current context follow-up will be helpful to ensure no signs of lymphangitic tumor. 5. LEFT basilar consolidation and patchy areas of tree in bud. Findings are concerning for pneumonia. 6. Soft tissue thickening over the RIGHT neck with signs of nodularity overlying the sternocleidomastoid muscle. Much of this could be due to  presence of venous collaterals about the RIGHT neck and upper chest though this is not clear. Follow-up ultrasound may be helpful for further evaluation of this area. 7. Small nodules at the LEFT lung base measuring 4 mm respectively. Nonspecific findings, suggest attention on follow-up. 8. Aortic atherosclerosis. Aortic Atherosclerosis (ICD10-I70.0). Electronically Signed: By: GZetta BillsM.D. On:  03/25/2022 18:17   DG Chest 2 View  Addendum Date: 03/25/2022   ADDENDUM REPORT: 03/25/2022 13:36 ADDENDUM: The cement embolization is likely chronic. Infarct would be felt unlikely given chronicity of cement embolization unless there was recent cement augmentation. On the lateral projection convex margins seen in the area of the RIGHT middle lobe raise the question of underlying mass. CT of the chest is suggested for further evaluation. Dense consolidation at the LEFT lower lobe and other airspace disease in the RIGHT chest concerning for pneumonia. These results were called by telephone at the time of interpretation on 03/25/2022 at 1:36 pm to provider Dr Joni Fears , who verbally acknowledged these results. Electronically Signed   By: Zetta Bills M.D.   On: 03/25/2022 13:36   Result Date: 03/25/2022 CLINICAL DATA:  Chest pain shortness of breath. EXAM: CHEST - 2 VIEW COMPARISON:  March 18, 2016.  No recent chest imaging. FINDINGS: Trachea is midline. Cardiomediastinal contours and hilar structures with mild cardiac enlargement. Dense tubular material at the RIGHT hilum new since previous imaging, interval kyphoplasty. Dense consolidation at the LEFT lung base is noted with small LEFT-sided pleural effusion. Small to moderate RIGHT-sided effusion with ovoid opacity in the RIGHT mid chest potentially loculated fluid or airspace process in the RIGHT mid chest peripheral areas of added density about the RIGHT hilum. Signs of spinal nerve stimulator in place, tips over the midthoracic spine. Faintly visualized  changes recent kyphoplasty in upper lumbar spine. Osteopenia without acute skeletal process. IMPRESSION: 1. Findings suspicious for bilateral pneumonia or aspiration with small effusions. Would also correlate with signs heart failure. 2. Suspected cement embolization to the RIGHT lung. Ovoid opacity in the RIGHT chest could reflect pneumonia or infarct or loculated pleural fluid within the major fissure at the peripheral lung. CT may be helpful for further evaluation. 3. Suggest follow-up to ensure resolution of above findings. Electronically Signed: By: Zetta Bills M.D. On: 03/25/2022 13:20    EKG: I have personally reviewed EKG: NSR, no acute changes  Assessment/Plan Principal Problem:   Loculated pleural effusion Active Problems:   SOB (shortness of breath)   CHF (congestive heart failure) (HCC)   HTN (hypertension)   GERD (gastroesophageal reflux disease)   Do not intubate, cardiopulmonary resuscitation (CPR)-only code status    Assessment and Plan: * Loculated pleural effusion Patient w/o evidence of infection/PNA. CXR and CTA reveal pleural loculated pleural effusions bilaterally, changes that could represent infection vs pulmonary edema, nodularity which may suggest malignancy. She has new onset CHF with pulmonary edema. She has a h/o smoking in the past,  breast cancer and uterine cancer raising possibility of malignant effusion. She is not in respiratory distress or respiratory failure. Dr. Vella Kohler was consulted by EDP.  Plan IR to do thoracentesis for diagnostic purposes: transudate vs exudate  Further plans to be based on pathology report.   CHF (congestive heart failure) (HCC) Patient w/o prior h/o CHF now with elevated BNP, abnormal CXR and CTA suggestive of pulmonary edema, infection unlikely w/o fever, productive cough or leukocytosis. She endorses progressive SOB/DOE over at least the past month along with peripheral edema. She denies rigors, fever at home, any chest pain.    Plan Tele admit  2D echo  Medical therapy: IV lasix q12, continue BB, add ARB  Heart failure protocol   GERD (gastroesophageal reflux disease) No active symptoms and she takes no medication  Plan Daily PPI while in-patient  HTN (hypertension) Patient has developed HTN in her later years. She  has been well controlled on BB  Plan Continue BB  Additional meds for CHF   Close monitoring of BP on new regimen  Do not intubate, cardiopulmonary resuscitation (CPR)-only code status Discussed with patient with daughter present. She was very clear about not wanting cardio-pulmonary resuscitation in the event of cardiac arrest or respiratory arrest.   Plan DNR/DNI   Patient is very [place] and she sent her hearing aids home with daughter    DVT prophylaxis: SQ Heparin Code Status: DNR/DNI(Do NOT Intubate) Family Communication: daughter present - understands Dx possibilities and Tx plan  Disposition Plan: TBD  Consults called: Pulmonary - Dr. Vella Kohler was called by EDP  Admission status: Inpatient, Telemetry bed   Adella Hare, MD [facility] 03/25/2022, 9:27 PM

## 2022-03-25 NOTE — ED Notes (Signed)
Pt transported down the hall, and ambulated with assistance to bathroom

## 2022-03-25 NOTE — Consult Note (Signed)
PHARMACY -  BRIEF ANTIBIOTIC NOTE   Pharmacy has received consult(s) for vancomycin and levofloxacin from an ED provider.  The patient's profile has been reviewed for ht/wt/allergies/indication/available labs.    One time order(s) placed for Vancomycin 1000 mg IV and Levofloxacin 750 mg IV  Further antibiotics/pharmacy consults should be ordered by admitting physician if indicated.                       Thank you, Alison Murray 03/25/2022  6:35 PM

## 2022-03-25 NOTE — Progress Notes (Signed)
CODE SEPSIS - PHARMACY COMMUNICATION  **Broad Spectrum Antibiotics should be administered within 1 hour of Sepsis diagnosis**  Time Code Sepsis Called/Page Received: 1824  Antibiotics Ordered: vancomycin and levofloxacin  Time of 1st antibiotic administration: 1838  Additional action taken by pharmacy: N/A    Alison Murray ,PharmD Clinical Pharmacist  03/25/2022  6:37 PM

## 2022-03-25 NOTE — Assessment & Plan Note (Signed)
No active symptoms and she takes no medication  Plan Daily PPI while in-patient

## 2022-03-26 ENCOUNTER — Encounter: Payer: Self-pay | Admitting: Internal Medicine

## 2022-03-26 ENCOUNTER — Inpatient Hospital Stay: Payer: Medicare Other

## 2022-03-26 ENCOUNTER — Inpatient Hospital Stay (HOSPITAL_COMMUNITY)
Admit: 2022-03-26 | Discharge: 2022-03-26 | Disposition: A | Discharge status: Home / Self Care | Payer: Medicare Other | Attending: Internal Medicine | Admitting: Internal Medicine

## 2022-03-26 DIAGNOSIS — I5031 Acute diastolic (congestive) heart failure: Secondary | ICD-10-CM | POA: Diagnosis not present

## 2022-03-26 DIAGNOSIS — J90 Pleural effusion, not elsewhere classified: Secondary | ICD-10-CM | POA: Diagnosis not present

## 2022-03-26 LAB — ECHOCARDIOGRAM COMPLETE
AR max vel: 0.84 cm2
AV Area VTI: 0.88 cm2
AV Area mean vel: 0.83 cm2
AV Mean grad: 17 mmHg
AV Peak grad: 24.4 mmHg
Ao pk vel: 2.47 m/s
Area-P 1/2: 4.41 cm2
Height: 64 in
MV VTI: 1.62 cm2
S' Lateral: 2.8 cm
Weight: 2229.29 oz

## 2022-03-26 LAB — PROCALCITONIN: Procalcitonin: <0.1 ng/mL

## 2022-03-26 LAB — BASIC METABOLIC PANEL
Anion gap: 7 (ref 5–15)
BUN: 18 mg/dL (ref 8–23)
CO2: 26 mmol/L (ref 22–32)
Calcium: 8.6 mg/dL — ABNORMAL LOW (ref 8.9–10.3)
Chloride: 105 mmol/L (ref 98–111)
Creatinine, Ser: 0.86 mg/dL (ref 0.44–1.00)
GFR, Estimated: >60 mL/min (ref >60)
Glucose, Bld: 83 mg/dL (ref 70–99)
Potassium: 3.8 mmol/L (ref 3.5–5.1)
Sodium: 138 mmol/L (ref 135–145)

## 2022-03-26 LAB — BRAIN NATRIURETIC PEPTIDE: B Natriuretic Peptide: 3106.5 pg/mL — ABNORMAL HIGH (ref 0.0–100.0)

## 2022-03-26 LAB — MRSA NEXT GEN BY PCR, NASAL: MRSA by PCR Next Gen: NOT DETECTED

## 2022-03-26 MED ORDER — AMLODIPINE BESYLATE 5 MG PO TABS
5.0000 mg | ORAL_TABLET | Freq: Every day | ORAL | Status: DC
  Administered 2022-03-26 – 2022-03-28 (×3): 5 mg via ORAL
  Filled 2022-03-26 (×3): qty 1

## 2022-03-26 MED ORDER — LIDOCAINE HCL (PF) 1 % IJ SOLN: 10.0000 mL | Freq: Once | INTRAMUSCULAR | Status: DC

## 2022-03-26 MED ORDER — VANCOMYCIN HCL 750 MG/150ML IV SOLN
750.0000 mg | INTRAVENOUS | Status: DC
  Administered 2022-03-26: 750 mg via INTRAVENOUS
  Filled 2022-03-26: qty 150

## 2022-03-26 MED ORDER — LEVOFLOXACIN IN D5W 750 MG/150ML IV SOLN
750.0000 mg | INTRAVENOUS | Status: AC
  Administered 2022-03-27 – 2022-03-29 (×2): 750 mg via INTRAVENOUS
  Filled 2022-03-26 (×2): qty 150

## 2022-03-26 NOTE — Progress Notes (Signed)
Patient was brought to ultrasound department in preparation for ordered thoracentesis procedure. Patient was scanned with ultrasound and the thoracentesis procedure, as well as its risks, benefits, and alternatives were discussed with the patient. After discussion, the patient became mildly tearful and stated she did not want to have the thoracentesis performed. Patient was informed that this was perfectly acceptable. No thoracentesis was attempted today. Please contact IR team with any questions or concerns. Lura Em, PA-C 03/26/2022 12:00 PM

## 2022-03-26 NOTE — Consult Note (Signed)
Pharmacy Antibiotic Note  Brandi Reid is a 89 y.o. female admitted on 03/25/2022 with loculated pleural effusion.  Pharmacy has been consulted for vancomycin and levofloxacin dosing for sepsis.  Levofloxacin 750 mg IV x 1 given 2/25 @ 1838 Vancomycin 1000 mg IV x 1 given 2/25 @ 2017  Plan: Start vancomycin 750 mg Iv every 24 hours Goal AUC 400-550 Estimated AUC 480.3, Cmin 13.3 Wt 63.2 kg, Scr 0.86, Vd coefficient  0.72 Vancomycin levels at steady state or as clinically necessary.  Start levofloxacin 750 mg IV every 48 hours Follow renal function for dosing adjustments   Height: '5\' 4"'$  (162.6 cm) Weight: 63.2 kg (139 lb 5.3 oz) IBW/kg (Calculated) : 54.7  Temp (24hrs), Avg:97.9 F (36.6 C), Min:97.3 F (36.3 C), Max:98.5 F (36.9 C)  Recent Labs  Lab 03/25/22 1248 03/25/22 1539 03/25/22 1701 03/26/22 0446  WBC 3.5*  --   --   --   CREATININE 0.89  --   --  0.86  LATICACIDVEN  --  1.4 1.5  --     Estimated Creatinine Clearance: 36 mL/min (by C-G formula based on SCr of 0.86 mg/dL).    Allergies  Allergen Reactions   Compazine [Prochlorperazine] Anaphylaxis   Penicillins Anaphylaxis    Did it involve swelling of the face/tongue/throat, SOB, or low BP? Yes Did it involve sudden or severe rash/hives, skin peeling, or any reaction on the inside of your mouth or nose? No Did you need to seek medical attention at a hospital or doctor's office? Yes When did it last happen? More than 30-40 years ago If all above answers are "NO", may proceed with cephalosporin use.     Antimicrobials this admission: Levofloxacin 2/25 >>  vancomycin 2/25 >>   Dose adjustments this admission: N/A  Microbiology results: 2/25 BCx: ngtd 2/26 MRSA PCR: ordered  Thank you for allowing pharmacy to be a part of this patient's care.  Lorin Picket, PharmD 03/26/2022 7:42 AM

## 2022-03-26 NOTE — Progress Notes (Signed)
PROGRESS NOTE    Brandi Reid  [id number] DOB: 06-Jun-1927 DOA: 03/25/2022 PCP: Sallee Lange, NP    Brief Narrative:  89 y/o independent woman with h/o breast cancer and uterine cancer remotely reports progressive SOB/DOE and increasing weakness over the past month or more. Her last OV PCP 01/31/22 she was stable and doing well. Other medical problems include HTN, OA knees, GERD, chronic anemia, spinal stenosis, h/o lumbar compression fracture s/p kyphoplasty, chronic back pain with failed implanted stimulator. She now presents due to severe SOB and weakness. She denies fever, chills, productive cough, chest pain, any GI symptoms.     Assessment & Plan:   Principal Problem:   Loculated pleural effusion Active Problems:   SOB (shortness of breath)   CHF (congestive heart failure) (HCC)   HTN (hypertension)   GERD (gastroesophageal reflux disease)   Do not intubate, cardiopulmonary resuscitation (CPR)-only code status  * Loculated pleural effusion Loculated pleural effusion of unclear etiology.  Unable to totally exclude infection but seems less likely at this time.  There is some nodularity which may suggest malignancy given remote history of cancer.  Also has some pulmonary edema complicating the situation.  Patient with a very complex oncologic history including breast and uterine cancer as well as a history of smoking in the past. Plan: IR consulted for thoracentesis Pathology ordered on pleural fluid  CHF (congestive heart failure) ([place]) Patient w/o prior h/o CHF now with elevated BNP, abnormal CXR and CTA suggestive of pulmonary edema, infection unlikely w/o fever, productive cough or leukocytosis. She endorses progressive SOB/DOE over at least the past month along with peripheral edema. She denies rigors, fever at home, any chest pain.  Plan: 2D echocardiogram completed, follow read Lasix 40 mg IV every 12 hours Continue beta-blocker, added ARB, add  amlodipine Strict ins and outs, daily weights  GERD (gastroesophageal reflux disease) No active symptoms and she takes no medication Daily PPI for stress ulcer prophylaxis   HTN (hypertension) Patient has developed HTN in her later years.  Hypertension now   Plan     Continue BB             Additional meds for CHF              Close monitoring of BP on new regimen   Do not intubate, cardiopulmonary resuscitation (CPR)-only code status Discussed with patient with daughter present. She was very clear about not wanting cardio-pulmonary resuscitation in the event of cardiac arrest or respiratory arrest.    Plan DNR/DNI     Patient is very [place] and she sent her hearing aids home with daughter   DVT prophylaxis: SQ heparin Code Status: DNR Family Communication: None today Disposition Plan: Status is: Inpatient Remains inpatient appropriate because: Loculated pleural effusion, hypoxia, shortness of breath.  Workup in progress.   Level of care: Telemetry Medical  Consultants:  IR  Procedures:  Thoracentesis 2/26  Antimicrobials: Vancomycin Levaquin   Subjective: Seen and examined.  History limited by hearing loss.  Resting in bed.  Reports no interval improvement.  No apparent distress  Objective: Vitals:   03/26/22 0024 03/26/22 0435 03/26/22 0609 03/26/22 0849  BP: (!) 183/81  (!) 177/80 (!) 182/80  Pulse: 77  77 79  Resp: '16  17 18  '$ Temp: 97.6 F (36.4 C)  (!) 97.5 F (36.4 C) 97.8 F (36.6 C)  TempSrc: Oral  Oral Oral  SpO2: 100%  97% 95%  Weight: 63.3 kg 63.2 kg  Height: '5\' 4"'$  (1.626 m)       Intake/Output Summary (Last 24 hours) at 03/26/2022 1102 Last data filed at 03/26/2022 0300 Gross per 24 hour  Intake 0 ml  Output 500 ml  Net -500 ml   Filed Weights   03/25/22 1241 03/26/22 0024 03/26/22 0435  Weight: 56.7 kg 63.3 kg 63.2 kg    Examination:  General exam: NAD.  Frail-appearing Respiratory system: Scattered crackles bilaterally.  No  wheeze.  Normal work of breathing.  Room air Cardiovascular system: [id number], RRR, 2/6 systolic murmur, no pedal edema Gastrointestinal system: Thin, soft, NT/ND, normal bowel sounds Central nervous system: Alert, oriented x 2 no focal deficits Extremities: Symmetric 5 x 5 power. Skin: No rashes, lesions or ulcers Psychiatry: Judgement and insight appear normal. Mood & affect appropriate.     Data Reviewed: I have personally reviewed following labs and imaging studies  CBC: Recent Labs  Lab 03/25/22 1248  WBC 3.5*  HGB 9.8*  HCT 30.4*  MCV 100.0  PLT [id number]*   Basic Metabolic Panel: Recent Labs  Lab 03/25/22 1248 03/26/22 0446  NA 139 138  K 4.1 3.8  CL 106 105  CO2 26 26  GLUCOSE 111* 83  BUN 21 18  CREATININE 0.89 0.86  CALCIUM 9.0 8.6*   GFR: Estimated Creatinine Clearance: 36 mL/min (by C-G formula based on SCr of 0.86 mg/dL). Liver Function Tests: No results for input(s): "AST", "ALT", "ALKPHOS", "BILITOT", "PROT", "ALBUMIN" in the last 168 hours. No results for input(s): "LIPASE", "AMYLASE" in the last 168 hours. No results for input(s): "AMMONIA" in the last 168 hours. Coagulation Profile: No results for input(s): "INR", "PROTIME" in the last 168 hours. Cardiac Enzymes: No results for input(s): "CKTOTAL", "CKMB", "CKMBINDEX", "TROPONINI" in the last 168 hours. BNP (last 3 results) No results for input(s): "PROBNP" in the last 8760 hours. HbA1C: No results for input(s): "HGBA1C" in the last 72 hours. CBG: No results for input(s): "GLUCAP" in the last 168 hours. Lipid Profile: No results for input(s): "CHOL", "HDL", "LDLCALC", "TRIG", "CHOLHDL", "LDLDIRECT" in the last 72 hours. Thyroid Function Tests: No results for input(s): "TSH", "T4TOTAL", "FREET4", "T3FREE", "THYROIDAB" in the last 72 hours. Anemia Panel: No results for input(s): "VITAMINB12", "FOLATE", "FERRITIN", "TIBC", "IRON", "RETICCTPCT" in the last 72 hours. Sepsis Labs: Recent Labs  Lab  03/25/22 1539 03/25/22 1701  PROCALCITON  --  <0.10  LATICACIDVEN 1.4 1.5    Recent Results (from the past 240 hour(s))  Blood culture (routine x 2)     Status: None (Preliminary result)   Collection Time: 03/25/22  3:24 PM   Specimen: BLOOD  Result Value Ref Range Status   Specimen Description BLOOD RIGHT ARM  Final   Special Requests   Final    BOTTLES DRAWN AEROBIC AND ANAEROBIC Blood Culture results may not be optimal due to an inadequate volume of blood received in culture bottles   Culture   Final    NO GROWTH < 24 HOURS Performed at [facility], [address], [place], [place] [address]    Report Status PENDING  Incomplete  Blood culture (routine x 2)     Status: None (Preliminary result)   Collection Time: 03/25/22  3:47 PM   Specimen: BLOOD  Result Value Ref Range Status   Specimen Description BLOOD RIGHT ARM  Final   Special Requests   Final    BOTTLES DRAWN AEROBIC AND ANAEROBIC Blood Culture results may not be optimal due to an inadequate volume of  blood received in culture bottles   Culture   Final    NO GROWTH < 24 HOURS Performed at [facility], [address] [place], [place] [address]    Report Status PENDING  Incomplete         Radiology Studies: CT Angio Chest Pulmonary Embolism (PE) W or WO Contrast  Addendum Date: 03/25/2022   ADDENDUM REPORT: 03/25/2022 18:43 ADDENDUM: Effusion sizes are moderate on the LEFT with a larger sub pulmonic component just over the LEFT hemidiaphragm and small to moderate on the RIGHT with loculated areas as discussed. These results were called by telephone at the time of interpretation on 03/25/2022 at 6:42 pm to provider Dr. Nickolas Madrid, Who verbally acknowledged these results. Electronically Signed   By: Zetta Bills M.D.   On: 03/25/2022 18:43   Result Date: 03/25/2022 CLINICAL DATA:  89 year old female presents for evaluation of suspected pulmonary embolism with shortness of breath and bilateral leg  swelling. History of uterine and breast cancer. * Tracking Code: BO * EXAM: CT ANGIOGRAPHY CHEST WITH CONTRAST TECHNIQUE: Multidetector CT imaging of the chest was performed using the standard protocol during bolus administration of intravenous contrast. Multiplanar CT image reconstructions and MIPs were obtained to evaluate the vascular anatomy. RADIATION DOSE REDUCTION: This exam was performed according to the departmental dose-optimization program which includes automated exposure control, adjustment of the mA and/or kV according to patient size and/or use of iterative reconstruction technique. CONTRAST:  83m OMNIPAQUE IOHEXOL 350 MG/ML SOLN COMPARISON:  None available FINDINGS: Cardiovascular: Calcified aortic atherosclerotic changes. Heart size moderately enlarged. Central pulmonary arteries are opacified to reported 83 Hounsfield units. There is no sign of venous thromboembolism in the chest. Sequela of prior cement embolism present in the RIGHT lower lobe pulmonary artery without occlusive features or signs of surrounding inflammation. Mediastinum/Nodes: Esophagus grossly normal. No discrete adenopathy in the chest. Soft tissue thickening over the RIGHT neck with signs of nodularity overlying the sternocleidomastoid muscle. Much of this could be due to presence of venous collaterals about the RIGHT neck and upper chest though this is not clear. Question of fullness of subcarinal nodal tissue and RIGHT paratracheal nodal tissue but these areas are difficult to assess due to dense contrast in the pulmonary vascular bed and in the SVC. No frank hilar nodal tissue with enlargement. Lungs/Pleura: Loculated pleural fluid throughout the chest with scalloped margins. No discrete pleural nodule is visible on this very early phase examination. Density of pleural fluid is low. Pleural fluid is loculated in the major fissure in the RIGHT chest tracking around the anterior RIGHT chest towards the level of the mid chest  and seen dependently tracking posterior to the RIGHT heart and over the RIGHT hemidiaphragm. There is sub pulmonic fluid in the LEFT chest with volume loss in the LEFT lower lobe. Signs of septal thickening throughout the chest greatest at the lung bases where there is the greatest amount of pleural fluid. Small nodules at the LEFT lung base measuring 4 mm on image 92 and 93 respectively. Airways are patent. LEFT basilar consolidation and patchy areas of tree in bud. Upper Abdomen: Incidental imaging of upper abdominal contents shows no acute process to the extent that can be evaluated on this chest CT based on timing of the bolus and artifact. Musculoskeletal: Spinal nerve stimulator in place. No chest wall mass.  Spinal degenerative changes and osteopenia. Review of the MIP images confirms the above findings. IMPRESSION: 1. No sign of venous thromboembolism in the chest. 2.  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 3. Loculated pleural fluid throughout the chest with scalloped margins. No discrete pleural nodule is visible on this very early phase examination. Given patient history of neoplasm this raises concern for malignant effusions. Effusions related to heart failure or volume overload would not be expected to have this loculated appearance. Post infectious pleural fluid would likely have greater enhancement. When viewed in the sagittal plane in the RIGHT chest there is frank nodularity though measuring lower density than expected for soft tissue along the major and minor fissures. 4. Patchy areas of ground-glass and septal thickening at the lung bases along with tree-in-bud nodularity could reflect mild pneumonitis. Would also correlate with signs of heart failure. In the current context follow-up will be helpful to ensure no signs of lymphangitic tumor. 5. LEFT basilar consolidation and patchy areas of tree in  bud. Findings are concerning for pneumonia. 6. Soft tissue thickening over the RIGHT neck with signs of nodularity overlying the sternocleidomastoid muscle. Much of this could be due to presence of venous collaterals about the RIGHT neck and upper chest though this is not clear. Follow-up ultrasound may be helpful for further evaluation of this area. 7. Small nodules at the LEFT lung base measuring 4 mm respectively. Nonspecific findings, suggest attention on follow-up. 8. Aortic atherosclerosis. Aortic Atherosclerosis (ICD10-I70.0). Electronically Signed: By: Zetta Bills M.D. 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 Infarct would be felt unlikely given chronicity of cement embolization unless there was recent cement augmentation. On the lateral projection convex margins seen in the area of the RIGHT middle lobe raise the question of underlying mass. CT of the chest is suggested for further evaluation. Dense consolidation at the LEFT lower lobe and other airspace disease in the RIGHT chest concerning for pneumonia. These results were called by telephone at the time of interpretation on 03/25/2022 at 1:36 pm to provider Dr Joni Fears , who verbally acknowledged these results. Electronically Signed   By: Zetta Bills M.D.   On: 03/25/2022 13:36   Result Date: 03/25/2022 CLINICAL DATA:  Chest pain shortness of breath. EXAM: CHEST - 2 VIEW COMPARISON:  March 18, 2016.  No recent chest imaging. FINDINGS: Trachea is midline. Cardiomediastinal contours and hilar structures with mild cardiac enlargement. Dense tubular material at the RIGHT hilum new since previous imaging, interval kyphoplasty. Dense consolidation at the LEFT lung base is noted with small LEFT-sided pleural effusion. Small to moderate RIGHT-sided effusion with ovoid opacity in the RIGHT mid chest potentially loculated fluid or  airspace process in the RIGHT mid chest peripheral areas of added density about the RIGHT hilum. Signs of spinal nerve stimulator in place, tips over the midthoracic spine. Faintly visualized changes recent kyphoplasty in upper lumbar spine. Osteopenia without acute skeletal process. IMPRESSION: 1. Findings suspicious for bilateral pneumonia or aspiration with small effusions. Would also correlate with signs heart failure. 2. Suspected cement embolization to the RIGHT lung. Ovoid opacity in the RIGHT chest could reflect pneumonia or infarct or loculated pleural fluid within the major fissure at the peripheral lung. CT may be helpful for further evaluation. 3. Suggest follow-up to ensure resolution of above findings. Electronically Signed: By: Zetta Bills M.D. On: 03/25/2022 13:20        Scheduled Meds:  amLODipine  5 mg Oral Daily  furosemide  40 mg Intravenous Q12H   heparin  5,000 Units Subcutaneous Q8H   losartan  25 mg Oral Daily   metoprolol tartrate  25 mg Oral BID   polyvinyl alcohol  1 drop Both Eyes Daily   senna  1 tablet Oral BID   Continuous Infusions:  [START ON 03/27/2022] levofloxacin (LEVAQUIN) IV     vancomycin       LOS: 1 day  \   Sidney Ace, MD [facility]   If 7PM-7AM, please contact night-coverage  03/26/2022, 11:02 AM

## 2022-03-26 NOTE — Significant Event (Addendum)
Patient was sent down interventional radiology for image guided thoracentesis.  Discussed with IR PA.  After interventional radiology explained risks and benefits and procedural details the patient declined the procedure.  IR PA attempted to call patient's family member twice with no answer.  I spoke to patients daughter 2/26 at 1500.  Patient is being brought back up to the floor.  Will attempt to treat medically.  Palliative care consult requested and pending.  Ralene Muskrat MD  No charge

## 2022-03-26 NOTE — Consult Note (Signed)
   Heart Failure Nurse Nurse Navigator Note  HF-Echocardiogram results pending on this admission.  She presented from home to the emergency room with complaints of shortness of breath and weakness.  BNP was 2271.  Chest x-ray revealed bilateral pleural effusions.  Comorbidities:  History of breast cancer History of uterine cancer Hypertension Osteoarthritis GERD Chronic anemia  Medications:  Amlodipine 5 mg daily Furosemide 40 mg IV every 12 hours Hydrochlorothiazide 12.5 mg daily Losartan 25 mg daily Metoprolol tartrate 25 mg 2 times a day  Labs:  BNP 3106, sodium 138, potassium 3.8, chloride 105, CO2 26, BUN 18, creatinine 0.86, estimated GFR greater than 60. Weight is 63.2 kg Intake not documented Output 500 mL   The patient is a very pleasant 89 year old female live by herself.  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  Drives herself to and from the office which is approximately 5 miles and then on Sundays will drive herself to church.  Discussed heart failure and what it means.  She states she prepares her own meals at home, for breakfast will have cereal and almond milk and banana, for lunch she is at the office he may get something from a restaurant but she will eat on it for 3 days such as beef and vegetables.  She has a scale but does not weigh herself daily, made aware of the importance of weighing daily and reporting 2 pound weight gain overnight or 5 pounds within a week along with changes in symptoms such as increasing shortness of breath etc. like what brought her into the emergency room.  Also went over the importance of fluid restriction and no more than 64 ounces in a days time.  Patient felt that she would maybe drink 5 to 6 cups of liquid daily but did not feel that she went over that.  She was given the living with heart failure teaching  booklet, zone magnet, info on low sodium and heart failure, weight chart.  She has no further questions, will continue to follow.  Also mae aware of appointment in the heart failure clinic on March 4 at [facility]

## 2022-03-26 NOTE — Progress Notes (Addendum)
Family wanted to see what pt oxgyen level is on room air. She dropped down to 88% on room air. Replaced her [place] with 2L and she is currently 98%

## 2022-03-26 NOTE — Plan of Care (Signed)

## 2022-03-26 NOTE — Progress Notes (Signed)
*  PRELIMINARY RESULTS* Echocardiogram 2D Echocardiogram has been performed.  Brandi Reid 03/26/2022, 8:52 AM

## 2022-03-26 NOTE — Plan of Care (Signed)
Case discussed with Dr Priscella Mann CT scans reviewed  Patient feels OK, and does not have any severe resp symptoms symptoms at this time I recommend Thoracentesis to assess fluid, patient now agrees to undergo procedure via IR  Nurse notified me that patient, daughter and grandson all agree with Thoracentesis

## 2022-03-27 ENCOUNTER — Other Ambulatory Visit: Payer: Self-pay

## 2022-03-27 ENCOUNTER — Inpatient Hospital Stay: Payer: Medicare Other

## 2022-03-27 DIAGNOSIS — J90 Pleural effusion, not elsewhere classified: Secondary | ICD-10-CM | POA: Diagnosis not present

## 2022-03-27 LAB — BODY FLUID CELL COUNT WITH DIFFERENTIAL
Eos, Fluid: 0 %
Lymphs, Fluid: 83 %
Monocyte-Macrophage-Serous Fluid: 10 %
Neutrophil Count, Fluid: 7 %
Total Nucleated Cell Count, Fluid: 474 cu mm

## 2022-03-27 LAB — PROTEIN, PLEURAL OR PERITONEAL FLUID: Total protein, fluid: <3 g/dL

## 2022-03-27 LAB — LACTATE DEHYDROGENASE, PLEURAL OR PERITONEAL FLUID: LD, Fluid: 67 U/L — ABNORMAL HIGH (ref 3–23)

## 2022-03-27 LAB — POTASSIUM: Potassium: 3.1 mmol/L — ABNORMAL LOW (ref 3.5–5.1)

## 2022-03-27 LAB — GLUCOSE, CAPILLARY: Glucose-Capillary: 106 mg/dL — ABNORMAL HIGH (ref 70–99)

## 2022-03-27 LAB — GLUCOSE, PLEURAL OR PERITONEAL FLUID: Glucose, Fluid: 128 mg/dL

## 2022-03-27 LAB — CREATININE, SERUM
Creatinine, Ser: 1.05 mg/dL — ABNORMAL HIGH (ref 0.44–1.00)
GFR, Estimated: 50 mL/min — ABNORMAL LOW (ref >60)

## 2022-03-27 LAB — MAGNESIUM: Magnesium: 1.4 mg/dL — ABNORMAL LOW (ref 1.7–2.4)

## 2022-03-27 MED ORDER — LIDOCAINE HCL (PF) 1 % IJ SOLN
10.0000 mL | Freq: Once | INTRAMUSCULAR | Status: AC
  Administered 2022-03-27: 10 mL via INTRADERMAL

## 2022-03-27 MED ORDER — MAGNESIUM SULFATE 2 GM/50ML IV SOLN
2.0000 g | Freq: Once | INTRAVENOUS | Status: AC
  Administered 2022-03-27: 2 g via INTRAVENOUS
  Filled 2022-03-27: qty 50

## 2022-03-27 MED ORDER — FUROSEMIDE 10 MG/ML IJ SOLN
40.0000 mg | Freq: Two times a day (BID) | INTRAMUSCULAR | Status: DC
  Administered 2022-03-27 – 2022-03-28 (×2): 40 mg via INTRAVENOUS
  Filled 2022-03-27 (×2): qty 4

## 2022-03-27 MED ORDER — POTASSIUM CHLORIDE CRYS ER 20 MEQ PO TBCR
40.0000 meq | EXTENDED_RELEASE_TABLET | Freq: Two times a day (BID) | ORAL | Status: DC
  Administered 2022-03-27 – 2022-03-28 (×4): 40 meq via ORAL
  Filled 2022-03-27 (×4): qty 2

## 2022-03-27 NOTE — Procedures (Signed)
PROCEDURE SUMMARY:  Successful US guided diagnostic and therapeutic left thoracentesis. Yielded 700 cc of clear, amber fluid. Pt tolerated procedure well. No immediate complications.  Specimen was sent for labs. CXR ordered.  EBL < 1 mL  Tyson Alias, Rexford Maus 03/27/2022 3:34 PM    '

## 2022-03-27 NOTE — Plan of Care (Addendum)
GOC consult noted. Patient is currently out of room and off unit at this time. Will reattempt tomorrow.

## 2022-03-27 NOTE — Consult Note (Signed)
[place] NOTE       Patient ID: Brandi Reid MRN: [id number] DOB/AGE: 1927/05/31 89 y.o.  Admit date: 03/25/2022 Referring Physician Dr. Priscella Mann Primary Physician Gaetano Net, NP  Primary Cardiologist Dr. Saralyn Pilar Reason for Consultation AoCHF  HPI: Brandi Reid is a 31yoF with a PMH of HTN, nonrheumatic mod MR, mod TR, and AV sclerosis by echo 03/2019, left breast cancer s/p mastectomy, uterine cancer s/p TAH, chronic back pain, hx tobacco use who presented to [facility] ED 03/25/22 with severe shortness of breath and weakness. CTA chest with loculated L pleural effusion, concern for a malignant effusion. Echo this admission resulted with severe mitral and tricuspid valve regurgitation, and moderate aortic stenosis for which cardiology is consulted.   The patient is rather hard of hearing but is alert and oriented and able to provide the entirety of the history below.  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  She has felt unsteady on her feet and significantly short of breath with ambulation, to the point where she started using her late husband's walker to feel comfortable getting around.  Sometimes she has to stop and catch her breath while walking around the home and has not felt comfortable going to the grocery store or running errands because of how weak and tired she feels.  She denies any chest pain, palpitations, orthopnea or PND.  She has noticed peripheral edema that has been persistent despite elevation of her legs.  She is prescribed Lasix 20 mg that she takes on an as-needed basis but has not been taking this recently.  Since admission, she had been given IV Lasix 40 mg x 3 doses, and also additional antihypertensives including losartan, metoprolol, and amlodipine. At my time of evaluation she is  sitting up in bed, resting with her eyes closed but awakens easily to voice.  She says she overall feels less tired but notes that she has not been up and moving since admission.  Vitals are notable for a blood pressure of 141/53, heart rate documented at 60 bpm, no telemetry was available earlier this morning labs are notable for hypokalemia with potassium 3.1, creatinine uptrending with diuresis from 0.86-1.05 and current GFR 50.  Hypomagnesemia 1.4.  BNP uptrending from 2271, 3106 despite diuresis.  High-sensitivity troponin borderline elevated and flat trending at 32, 32.  CTA chest performed on day of admission was negative for pulmonary embolus but did reveal a loculated pleural effusion throughout the chest with scalloped margins concern for a malignant infusion.  Thoracentesis was attempted 2/26 but aborted due to patient discomfort, reattempt planned for today.  Review of systems complete and found to be negative unless listed above     Past Medical History:  Diagnosis Date   Anemia    Breast cancer ([place]) 2005   left positive   Cancer ([place]) 2005   lt lumpectomy/chemo and rad   Complication of anesthesia    pt states she broke out in a sweat and felt like she was going to pass out during a dental procedure-no problems with surgeries though   GERD (gastroesophageal reflux disease)    History of hiatal hernia    Hypertension    Osteoarthritis, knee    Spinal stenosis    Uterine cancer [facility])     Past Surgical History:  Procedure Laterality Date  ABDOMINAL HYSTERECTOMY     APPENDECTOMY     age 53   BREAST BIOPSY Right 1980   neg   BREAST CYST EXCISION Right    BREAST LUMPECTOMY     CHOLECYSTECTOMY     KYPHOPLASTY N/A 10/30/2018   Procedure: L2 , L3 KYPHOPLASTY;  Surgeon: Hessie Knows, MD;  Location: [facility] ORS;  Service: Orthopedics;  Laterality: N/A;    Medications Prior to Admission  Medication Sig Dispense Refill Last Dose   ADVIL DUAL ACTION 125-250 MG TABS Take 2  tablets by mouth every 8 (eight) hours as needed (pain.).   unk   cholecalciferol (VITAMIN D) 25 MCG (1000 UT) tablet Take 1,000 Units by mouth every evening.   03/24/2022   hydrochlorothiazide (HYDRODIURIL) 25 MG tablet Take 12.5 mg by mouth daily as needed.   unk   metoprolol tartrate (LOPRESSOR) 50 MG tablet Take 25 mg by mouth 2 (two) times daily.   03/25/2022 at 0830   Multiple Vitamins-Minerals (PRESERVISION AREDS 2 PO) Take 1 tablet by mouth 2 (two) times daily.   03/25/2022   Polyethyl Glycol-Propyl Glycol (LUBRICANT EYE DROPS) 0.4-0.3 % SOLN Place 1 drop into both eyes daily.   03/24/2022   HYDROcodone-acetaminophen (NORCO) 5-325 MG tablet Take 1 tablet by mouth every 6 (six) hours as needed. (Patient not taking: Reported on 03/25/2022) 15 tablet 0 Completed Course   Social History   Socioeconomic History   Marital status: Widowed    Spouse name: Not on file   Number of children: Not on file   Years of education: Not on file   Highest education level: Not on file  Occupational History   Not on file  Tobacco Use   Smoking status: Never   Smokeless tobacco: Never  Vaping Use   Vaping Use: Never used  Substance and Sexual Activity   Alcohol use: No   Drug use: No   Sexual activity: Not on file  Other Topics Concern   Not on file  Social History Narrative   Not on file   Social Determinants of Health   Financial Resource Strain: Not on file  Food Insecurity: No Food Insecurity (03/26/2022)   Hunger Vital Sign    Worried About Running Out of Food in the Last Year: Never true    Ran Out of Food in the Last Year: Never true  Transportation Needs: No Transportation Needs (03/26/2022)   PRAPARE - Hydrologist (Medical): No    Lack of Transportation (Non-Medical): No  Physical Activity: Not on file  Stress: Not on file  Social Connections: Not on file  Intimate Partner Violence: Not At Risk (03/26/2022)   Humiliation, Afraid, Rape, and Kick  questionnaire    Fear of Current or Ex-Partner: No    Emotionally Abused: No    Physically Abused: No    Sexually Abused: No    Family History  Problem Relation Age of Onset   Breast cancer Daughter 91      Intake/Output Summary (Last 24 hours) at 03/27/2022 0853 Last data filed at 03/27/2022 0600 Gross per 24 hour  Intake 210 ml  Output 1600 ml  Net -1390 ml    Vitals:   03/26/22 2206 03/27/22 0242 03/27/22 0426 03/27/22 0700  BP: (!) 148/75  (!) 151/69   Pulse: 77  67   Resp: 20  18   Temp: 98.9 F (37.2 C)  98.4 F (36.9 C) 97.8 F (36.6 C)  TempSrc:  SpO2: 96%  93%   Weight:  64.5 kg    Height:        PHYSICAL EXAM General: Pleasant thin elderly Caucasian female, in no acute distress.  Sitting upright in hospital bed sleeping with eyes closed but awakens easily to voice. HEENT:  Normocephalic and atraumatic.  Hard of hearing,  Neck:  + JVD.  Lungs: Normal respiratory effort on oxygen by nasal cannula.  Absent breath sounds left base, crackles on the right  heart: HRRR . Normal S1 and S2 without gallops.  2/6 systolic murmur best heard at the apex. Abdomen: Non-distended appearing.  Msk: Normal strength and tone for age. Extremities: Warm and well perfused. No clubbing, cyanosis.  1+ bilateral peripheral edema.  Neuro: Alert and oriented X 3. Psych:  Answers questions appropriately.   Labs: Basic Metabolic Panel: Recent Labs    03/25/22 1248 03/26/22 0446 03/27/22 0404  NA 139 138  --   K 4.1 3.8  --   CL 106 105  --   CO2 26 26  --   GLUCOSE 111* 83  --   BUN 21 18  --   CREATININE 0.89 0.86 1.05*  CALCIUM 9.0 8.6*  --    Liver Function Tests: No results for input(s): "AST", "ALT", "ALKPHOS", "BILITOT", "PROT", "ALBUMIN" in the last 72 hours. No results for input(s): "LIPASE", "AMYLASE" in the last 72 hours. CBC: Recent Labs    03/25/22 1248  WBC 3.5*  HGB 9.8*  HCT 30.4*  MCV 100.0  PLT 112*   Cardiac Enzymes: Recent Labs     03/25/22 1248 03/25/22 1701  TROPONINIHS 32* 32*   BNP: Recent Labs    03/25/22 1248 03/26/22 0446  BNP 2,271.6* 3,106.5*   D-Dimer: No results for input(s): "DDIMER" in the last 72 hours. Hemoglobin A1C: No results for input(s): "HGBA1C" in the last 72 hours. Fasting Lipid Panel: No results for input(s): "CHOL", "HDL", "LDLCALC", "TRIG", "CHOLHDL", "LDLDIRECT" in the last 72 hours. Thyroid Function Tests: No results for input(s): "TSH", "T4TOTAL", "T3FREE", "THYROIDAB" in the last 72 hours.  Invalid input(s): "FREET3" Anemia Panel: No results for input(s): "VITAMINB12", "FOLATE", "FERRITIN", "TIBC", "IRON", "RETICCTPCT" in the last 72 hours.   Radiology: [place] CHEST (PLEURAL EFFUSION)  Result Date: 03/26/2022 CLINICAL DATA:  Pleural effusion EXAM: CHEST ULTRASOUND COMPARISON:  None Available. FINDINGS: Focused sonographic exam of the right and left chest was performed for fluid assessment. Images of the right chest demonstrate a small and loculated pleural effusion. Images of the left foot demonstrate a moderate sized effusion with free fluid. IMPRESSION: 1. Moderate left pleural effusion with free fluid, amenable to image guided thoracentesis if desired. 2. Small and loculated right pleural effusion not amenable to image guided thoracentesis. 3. Patient declined thoracentesis at the time of this examination. Electronically Signed   By: Albin Felling M.D.   On: 03/26/2022 16:18   ECHOCARDIOGRAM COMPLETE  Result Date: 03/26/2022    ECHOCARDIOGRAM REPORT   Patient Name:   SHADEA INGERMAN [facility] Date of Exam: 03/26/2022 Medical Rec #:  [id number]                  Height:       64.0 in Accession #:    [id number]                 Weight:       139.3 lb Date of Birth:  October 19, 1927  BSA:          1.678 m Patient Age:    37 years                   BP:           182/80 mmHg Patient Gender: F                          HR:           79 bpm. Exam Location:  [facility] Procedure: 2D Echo,  Cardiac Doppler and Color Doppler Indications:     CHF-acute diastolic [id number]  History:         Patient has no prior history of Echocardiogram examinations.                  Risk Factors:Hypertension.  Sonographer:     Sherrie Sport Referring Phys:  [id number] MICHAEL E NORINS Diagnosing Phys: Nelva Bush MD  Sonographer Comments: Image acquisition challenging due to patient body habitus. IMPRESSIONS  1. Left ventricular ejection fraction, by estimation, is 50 to 55%. The left ventricle has low normal function. The left ventricle has no regional wall motion abnormalities. There is moderate left ventricular hypertrophy. Left ventricular diastolic function could not be evaluated.  2. Right ventricular systolic function is normal. The right ventricular size is normal. Mildly increased right ventricular wall thickness. There is severely elevated pulmonary artery systolic pressure. The estimated right ventricular systolic pressure is [id number] mmHg.  3. Left atrial size was mildly dilated.  4. Moderate pleural effusion in the left lateral region.  5. The mitral valve is degenerative. Severe mitral valve regurgitation. No evidence of mitral stenosis. Severe mitral annular calcification.  6. Tricuspid valve regurgitation is severe.  7. The aortic valve has an indeterminant number of cusps. There is moderate calcification of the aortic valve. There is severe thickening of the aortic valve. Aortic valve regurgitation is not visualized. Moderate aortic valve stenosis. Aortic valve area, by VTI measures 0.88 cm. Aortic valve mean gradient measures 17.0 mmHg.  8. The inferior vena cava is dilated in size with <50% respiratory variability, suggesting right atrial pressure of 15 mmHg. FINDINGS  Left Ventricle: Left ventricular ejection fraction, by estimation, is 50 to 55%. The left ventricle has low normal function. The left ventricle has no regional wall motion abnormalities. The left ventricular internal cavity size was normal in size.  There is moderate left ventricular hypertrophy. Left ventricular diastolic function could not be evaluated due to mitral annular calcification (moderate or greater). Left ventricular diastolic function could not be evaluated. Right Ventricle: The right ventricular size is normal. Mildly increased right ventricular wall thickness. Right ventricular systolic function is normal. There is severely elevated pulmonary artery systolic pressure. The tricuspid regurgitant velocity is 4.47 m/s, and with an assumed right atrial pressure of 15 mmHg, the estimated right ventricular systolic pressure is [id number] mmHg. Left Atrium: Left atrial size was mildly dilated. Right Atrium: Right atrial size was normal in size. Pericardium: There is no evidence of pericardial effusion. Mitral Valve: The mitral valve is degenerative in appearance. There is mild thickening of the mitral valve leaflet(s). Severe mitral annular calcification. Severe mitral valve regurgitation. No evidence of mitral valve stenosis. MV peak gradient, 6.5 mmHg. The mean mitral valve gradient is 3.0 mmHg. Tricuspid Valve: The tricuspid valve is not well visualized. Tricuspid valve regurgitation is severe. Aortic Valve: The aortic valve has an indeterminant number of cusps. There is  moderate calcification of the aortic valve. There is severe thickening of the aortic valve. Aortic valve regurgitation is not visualized. Moderate aortic stenosis is present. Aortic valve mean gradient measures 17.0 mmHg. Aortic valve peak gradient measures 24.4 mmHg. Aortic valve area, by VTI measures 0.88 cm. Pulmonic Valve: The pulmonic valve was not well visualized. Pulmonic valve regurgitation is mild. No evidence of pulmonic stenosis. Aorta: The aortic root is normal in size and structure. Pulmonary Artery: The pulmonary artery is not well seen. Venous: The inferior vena cava is dilated in size with less than 50% respiratory variability, suggesting right atrial pressure of 15 mmHg.  IAS/Shunts: The interatrial septum was not well visualized. Additional Comments: There is a moderate pleural effusion in the left lateral region.  LEFT VENTRICLE PLAX 2D LVIDd:         3.80 cm   Diastology LVIDs:         2.80 cm   LV e' medial:    5.44 cm/s LV PW:         1.50 cm   LV E/e' medial:  16.4 LV IVS:        1.50 cm   LV e' lateral:   6.53 cm/s LVOT diam:     1.90 cm   LV E/e' lateral: 13.6 LV SV:         47 LV SV Index:   28 LVOT Area:     2.84 cm  RIGHT VENTRICLE RV Basal diam:  3.50 cm RV Mid diam:    3.20 cm RV S prime:     12.80 cm/s TAPSE (M-mode): 2.6 cm LEFT ATRIUM             Index        RIGHT ATRIUM           Index LA diam:        4.10 cm 2.44 cm/m   RA Area:     13.70 cm LA Vol (A2C):   70.6 ml 42.08 ml/m  RA Volume:   33.30 ml  19.85 ml/m LA Vol (A4C):   51.7 ml 30.81 ml/m LA Biplane Vol: 63.4 ml 37.79 ml/m  AORTIC VALVE AV Area (Vmax):    0.84 cm AV Area (Vmean):   0.83 cm AV Area (VTI):     0.88 cm AV Vmax:           247.20 cm/s AV Vmean:          177.200 cm/s AV VTI:            0.533 m AV Peak Grad:      24.4 mmHg AV Mean Grad:      17.0 mmHg LVOT Vmax:         73.40 cm/s LVOT Vmean:        51.900 cm/s LVOT VTI:          0.166 m LVOT/AV VTI ratio: 0.31  AORTA Ao Root diam: 2.30 cm MITRAL VALVE               TRICUSPID VALVE MV Area (PHT): 4.41 cm    TR Peak grad:   79.9 mmHg MV Area VTI:   1.62 cm    TR Vmax:        447.00 cm/s MV Peak grad:  6.5 mmHg MV Mean grad:  3.0 mmHg    SHUNTS MV Vmax:       1.27 m/s    Systemic VTI:  0.17 m MV Vmean:  78.6 cm/s   Systemic Diam: 1.90 cm MV Decel Time: 172 msec MV E velocity: 89.10 cm/s MV A velocity: 96.40 cm/s MV E/A ratio:  0.92 Harrell Gave End MD Electronically signed by Nelva Bush MD Signature Date/Time: 03/26/2022/4:00:38 PM    Final    CT Angio Chest Pulmonary Embolism (PE) W or WO Contrast  Addendum Date: 03/25/2022   ADDENDUM REPORT: 03/25/2022 18:43 ADDENDUM: Effusion sizes are moderate on the LEFT with a larger sub  pulmonic component just over the LEFT hemidiaphragm and small to moderate on the RIGHT with loculated areas as discussed. These results were called by telephone at the time of interpretation on 03/25/2022 at 6:42 pm to provider Dr. Nickolas Madrid, Who verbally acknowledged these results. Electronically Signed   By: Zetta Bills M.D.   On: 03/25/2022 18:43   Result Date: 03/25/2022 CLINICAL DATA:  89 year old female presents for evaluation of suspected pulmonary embolism with shortness of breath and bilateral leg swelling. History of uterine and breast cancer. * Tracking Code: BO * EXAM: CT ANGIOGRAPHY CHEST WITH CONTRAST TECHNIQUE: Multidetector CT imaging of the chest was performed using the standard protocol during bolus administration of intravenous contrast. Multiplanar CT image reconstructions and MIPs were obtained to evaluate the vascular anatomy. RADIATION DOSE REDUCTION: This exam was performed according to the departmental dose-optimization program which includes automated exposure control, adjustment of the mA and/or kV according to patient size and/or use of iterative reconstruction technique. CONTRAST:  24m OMNIPAQUE IOHEXOL 350 MG/ML SOLN COMPARISON:  None available FINDINGS: Cardiovascular: Calcified aortic atherosclerotic changes. Heart size moderately enlarged. Central pulmonary arteries are opacified to reported 83 Hounsfield units. There is no sign of venous thromboembolism in the chest. Sequela of prior cement embolism present in the RIGHT lower lobe pulmonary artery without occlusive features or signs of surrounding inflammation. Mediastinum/Nodes: Esophagus grossly normal. No discrete adenopathy in the chest. Soft tissue thickening over the RIGHT neck with signs of nodularity overlying the sternocleidomastoid muscle. Much of this could be due to presence of venous collaterals about the RIGHT neck and upper chest though this is not clear. Question of fullness of subcarinal nodal tissue and RIGHT  paratracheal nodal tissue but these areas are difficult to assess due to dense contrast in the pulmonary vascular bed and in the SVC. No frank hilar nodal tissue with enlargement. Lungs/Pleura: Loculated pleural fluid throughout the chest with scalloped margins. No discrete pleural nodule is visible on this very early phase examination. Density of pleural fluid is low. Pleural fluid is loculated in the major fissure in the RIGHT chest tracking around the anterior RIGHT chest towards the level of the mid chest and seen dependently tracking posterior to the RIGHT heart and over the RIGHT hemidiaphragm. There is sub pulmonic fluid in the LEFT chest with volume loss in the LEFT lower lobe. Signs of septal thickening throughout the chest greatest at the lung bases where there is the greatest amount of pleural fluid. Small nodules at the LEFT lung base measuring 4 mm on image 92 and 93 respectively. Airways are patent. LEFT basilar consolidation and patchy areas of tree in bud. Upper Abdomen: Incidental imaging of upper abdominal contents shows no acute process to the extent that can be evaluated on this chest CT based on timing of the bolus and artifact. Musculoskeletal: Spinal nerve stimulator in place. No chest wall mass.  Spinal degenerative changes and osteopenia. Review of the MIP images confirms the above findings. IMPRESSION: 1. No sign of venous thromboembolism in the chest. 2. Sequela  of prior cement embolism in the RIGHT lower lobe pulmonary artery without occlusive features or signs of surrounding inflammation. 3. Loculated pleural fluid throughout the chest with scalloped margins. No discrete pleural nodule is visible on this very early phase examination. Given patient history of neoplasm this raises concern for malignant effusions. Effusions related to heart failure or volume overload would not be expected to have this loculated appearance. Post infectious pleural fluid would likely have greater enhancement.  When viewed in the sagittal plane in the RIGHT chest there is frank nodularity though measuring lower density than expected for soft tissue along the major and minor fissures. 4. Patchy areas of ground-glass and septal thickening at the lung bases along with tree-in-bud nodularity could reflect mild pneumonitis. Would also correlate with signs of heart failure. In the current context follow-up will be helpful to ensure no signs of lymphangitic tumor. 5. LEFT basilar consolidation and patchy areas of tree in bud. Findings are concerning for pneumonia. 6. Soft tissue thickening over the RIGHT neck with signs of nodularity overlying the sternocleidomastoid muscle. Much of this could be due to presence of venous collaterals about the RIGHT neck and upper chest though this is not clear. Follow-up ultrasound may be helpful for further evaluation of this area. 7. Small nodules at the LEFT lung base measuring 4 mm respectively. Nonspecific findings, suggest attention on follow-up. 8. Aortic atherosclerosis. Aortic Atherosclerosis (ICD10-I70.0). Electronically Signed: By: Zetta Bills M.D. 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 Infarct would be felt unlikely given chronicity of cement embolization unless there was recent cement augmentation. On the lateral projection convex margins seen in the area of the RIGHT middle lobe raise the question of underlying mass. CT of the chest is suggested for further evaluation. Dense consolidation at the LEFT lower lobe and other airspace disease in the RIGHT chest concerning for pneumonia. These results were called by telephone at the time of interpretation on 03/25/2022 at 1:36 pm to provider Dr Joni Fears , who verbally acknowledged these results. Electronically Signed   By: Zetta Bills M.D.   On: 03/25/2022 13:36   Result Date: 03/25/2022 CLINICAL DATA:  Chest  pain shortness of breath. EXAM: CHEST - 2 VIEW COMPARISON:  March 18, 2016.  No recent chest imaging. FINDINGS: Trachea is midline. Cardiomediastinal contours and hilar structures with mild cardiac enlargement. Dense tubular material at the RIGHT hilum new since previous imaging, interval kyphoplasty. Dense consolidation at the LEFT lung base is noted with small LEFT-sided pleural effusion. Small to moderate RIGHT-sided effusion with ovoid opacity in the RIGHT mid chest potentially loculated fluid or airspace process in the RIGHT mid chest peripheral areas of added density about the RIGHT hilum. Signs of spinal nerve stimulator in place, tips over the midthoracic spine. Faintly visualized changes recent kyphoplasty in upper lumbar spine. Osteopenia without acute skeletal process. IMPRESSION: 1. Findings suspicious for bilateral pneumonia or aspiration with small effusions. Would also correlate with signs heart failure. 2. Suspected cement embolization to the RIGHT lung. Ovoid opacity in the RIGHT chest could reflect pneumonia or infarct or loculated pleural fluid within the major fissure at the peripheral lung. CT may be helpful for further evaluation. 3. Suggest follow-up to ensure resolution of above findings. Electronically Signed: By: Zetta Bills M.D. On: 03/25/2022 13:20    ECHO   1. Left ventricular ejection fraction, by estimation, is 50 to 55%. The  left ventricle has low normal function. The left ventricle has no regional  wall motion abnormalities. There is moderate left ventricular hypertrophy.  Left ventricular diastolic  function could not be evaluated.   2. Right ventricular systolic function is normal. The right ventricular  size is normal. Mildly increased right ventricular wall thickness. There  is severely elevated pulmonary artery systolic pressure. The estimated  right ventricular systolic pressure  is [id number] mmHg.   3. Left atrial size was mildly dilated.   4. Moderate pleural  effusion in the left lateral region.   5. The mitral valve is degenerative. Severe mitral valve regurgitation.  No evidence of mitral stenosis. Severe mitral annular calcification.   6. Tricuspid valve regurgitation is severe.   7. The aortic valve has an indeterminant number of cusps. There is  moderate calcification of the aortic valve. There is severe thickening of  the aortic valve. Aortic valve regurgitation is not visualized. Moderate  aortic valve stenosis. Aortic valve  area, by VTI measures 0.88 cm. Aortic valve mean gradient measures 17.0  mmHg.   8. The inferior vena cava is dilated in size with <50% respiratory  variability, suggesting right atrial pressure of 15 mmHg.   03/17/2019 NORMAL LEFT VENTRICULAR SYSTOLIC FUNCTION   WITH MILD LVH  NORMAL RIGHT VENTRICULAR SYSTOLIC FUNCTION  MODERATE VALVULAR REGURGITATION (See above)  NO VALVULAR STENOSIS  MODERATE TR  SCLEROTIC AoV  MILD MR  EF 55%   TELEMETRY reviewed by me (LT) 03/27/2022 : None currently available  EKG reviewed by me: NSR rate 74  Data reviewed by me (LT) 03/27/2022: Last outpatient cardiology clinic note, ED note, admission H&P, hospitalist progress note last 24h vitals tele labs imaging I/O   Principal Problem:   Loculated pleural effusion Active Problems:   SOB (shortness of breath)   HTN (hypertension)   GERD (gastroesophageal reflux disease)   CHF (congestive heart failure) ([place])   Do not intubate, cardiopulmonary resuscitation (CPR)-only code status    ASSESSMENT AND PLAN:  Mckinna C. Cicchetti is a 22yoF with a PMH of HTN, nonrheumatic mod MR, mod TR, and AV sclerosis by echo 03/2019, left breast cancer s/p mastectomy, uterine cancer s/p TAH, chronic back pain, hx tobacco use who presented to [facility] ED 03/25/22 with severe shortness of breath and weakness. CTA chest with loculated L pleural effusion, concern for a malignant effusion. Echo this admission resulted with severe mitral and tricuspid  valve regurgitation, and moderate aortic stenosis for which cardiology is consulted.   # Loculated pleural effusions Seen on initial CTA chest with concern for malignant effusions, plan for IR thoracentesis today  # Acute on chronic HFpEF (EF 50-55%, moderate LVH) # Severe MR, TR # Moderate aortic stenosis Valve regurg worsening since 2021 by echo, previously managed with as needed Lasix 20 mg once daily which the patient admits to not taking recently.  Presents with 3-4 weeks of profound weakness and fatigue, dyspnea on exertion and peripheral edema.  Clinically hypervolemic with 1+ pitting edema bilaterally and an oxygen requirement, BNP of 3100. -S/p IV Lasix 40 mg x 3 doses, continue twice daily dosing for now.  Will likely need daily loop diuretic at discharge -Agree with metoprolol tartrate 25 mg twice daily, losartan 25 mg once daily -Monitor and replete electrolytes for a K >4, mag >2 -Will continue to optimize volume status, unlikely a candidate for surgical valve repair due to advanced age. Will also await pleural fluid cytology  # Demand ischemia Borderline elevated HS-Tn flat trending  at 38, 32 in the absence of chest pain or EKG changes this is most consistent with demand/supply mismatch and not ACS.  This patient's plan of care was discussed and created with Dr. Clayborn Bigness and he is in agreement.  Signed: Tristan Schroeder , PA-C 03/27/2022, 8:53 AM [facility]

## 2022-03-27 NOTE — Progress Notes (Signed)
       CROSS COVER NOTE  NAME: Brandi Reid MRN: [id number] DOB : 11/02/1927 ATTENDING PHYSICIAN: Sidney Ace, MD    Date of Service   03/27/2022   HPI/Events of Note   Responded to overhead CODE BLUE after a likely vasovagal event while using the restroom. Patient is DNR and did not lose a pulse.  At bedside Brandi Reid is alert and appears fatigued. She has normal work of breathing with fine crackles in lung bases. S1, S2 heart tones heard with murmur. Her extremities are warm with +2 radial pulses and +1 pedal pulses.   Interventions   Assessment/Plan:  EKG Hold PM lasix   Hold PM Metoprolol HR 61 (Has been 70s-80s) BP 143/53      To reach the provider On-Call:   7AM- 7PM see care teams to locate the attending and reach out to them via [email or web address] Password: [email or web address] 7PM-7AM contact night-coverage If you still have difficulty reaching the appropriate provider, please page the [facility] (Director on Call) for [facility] on amion for assistance  This document was prepared using Systems analyst and may include unintentional dictation errors.  Neomia Glass DNP, MBA, FNP-BC, PMHNP-BC Nurse Practitioner [facility] Pager [phone]

## 2022-03-27 NOTE — Progress Notes (Signed)
PROGRESS NOTE    Brandi Reid  [id number] DOB: Aug 31, 1927 DOA: 03/25/2022 PCP: Sallee Lange, NP    Brief Narrative:  89 y/o independent woman with h/o breast cancer and uterine cancer remotely reports progressive SOB/DOE and increasing weakness over the past month or more. Her last OV PCP 01/31/22 she was stable and doing well. Other medical problems include HTN, OA knees, GERD, chronic anemia, spinal stenosis, h/o lumbar compression fracture s/p kyphoplasty, chronic back pain with failed implanted stimulator. She now presents due to severe SOB and weakness. She denies fever, chills, productive cough, chest pain, any GI symptoms.    2/27: Attempted to complete thoracentesis yesterday however patient apparently became overwhelmed and declined to have the procedure done.  I spoke to her daughter over the phone who did agree that thoracentesis was necessary and she stated she would talk to her mother.  I also discussed the case with pulmonary who reviewed the films.  Indicates that thoracentesis is necessary however patient does not need chest tube at this time.   Assessment & Plan:   Principal Problem:   Loculated pleural effusion Active Problems:   SOB (shortness of breath)   CHF (congestive heart failure) (HCC)   HTN (hypertension)   GERD (gastroesophageal reflux disease)   Do not intubate, cardiopulmonary resuscitation (CPR)-only code status  * Loculated pleural effusion Loculated pleural effusion of unclear etiology.  Unable to totally exclude infection but seems less likely at this time.  There is some nodularity which may suggest malignancy given remote history of cancer.  Also has some pulmonary edema complicating the situation.  Patient with a very complex oncologic history including breast and uterine cancer as well as a history of smoking in the past. Patient initially declined to have thoracentesis done on 2/26.  IR reconsulted Plan: IR consulted for  thoracentesis, should be done today Pathology ordered on pleural fluid Hold off on chest tube for now  CHF (congestive heart failure) ([place]), suspect diastolic Patient w/o prior h/o CHF now with elevated BNP, abnormal CXR and CTA suggestive of pulmonary edema, infection unlikely w/o fever, productive cough or leukocytosis. She endorses progressive SOB/DOE over at least the past month along with peripheral edema. She denies rigors, fever at home, any chest pain.  Plan: 2D echocardiogram completed, normal ejection fraction Continue Lasix 40 mg IV every 12 hours Continue beta-blocker, added ARB, add amlodipine Strict ins and outs, daily weights [facility] consulted  GERD (gastroesophageal reflux disease) No active symptoms and she takes no medication Daily PPI for stress ulcer prophylaxis   HTN (hypertension) Patient has developed HTN in her later years.  Hypertension now Beta-blocker and ARB amlodipine   Do not intubate, cardiopulmonary resuscitation (CPR)-only code status Discussed with patient with daughter present. She was very clear about not wanting cardio-pulmonary resuscitation in the event of cardiac arrest or respiratory arrest.    Plan DNR/DNI     Patient is very [place] and she sent her hearing aids home with daughter   DVT prophylaxis: SQ heparin Code Status: DNR Family Communication: Daughter via phone 2/26, 2/27 Disposition Plan: Status is: Inpatient Remains inpatient appropriate because: Loculated pleural effusion, hypoxia, shortness of breath.  Workup in progress.   Level of care: Telemetry Medical  Consultants:  IR Pulmonary Cardiology Palliative care  Procedures:  Thoracentesis 2/26  Antimicrobials: Vancomycin Levaquin   Subjective: Patient seen and examined.  History limited by hearing loss.  Resting in bed.  No interval improvement.  No apparent distress  Objective:  Vitals:   03/26/22 2206 03/27/22 0242 03/27/22 0426 03/27/22 0700   BP: (!) 148/75  (!) 151/69 (!) 179/65  Pulse: 77  67 69  Resp: '20  18 18  '$ Temp: 98.9 F (37.2 C)  98.4 F (36.9 C) 97.8 F (36.6 C)  TempSrc:      SpO2: 96%  93%   Weight:  64.5 kg    Height:        Intake/Output Summary (Last 24 hours) at 03/27/2022 1120 Last data filed at 03/27/2022 1020 Gross per 24 hour  Intake 450 ml  Output 1600 ml  Net -1150 ml   Filed Weights   03/26/22 0024 03/26/22 0435 03/27/22 0242  Weight: 63.3 kg 63.2 kg 64.5 kg    Examination:  General exam: NAD.  Appears stated age Respiratory system: Bilateral scattered crackles.  Normal work of breathing.  Room air Cardiovascular system: [id number], RRR, 2/6 systolic murmur, no pedal edema Gastrointestinal system: Thin, soft, NT/ND, normal bowel sounds Central nervous system: Alert, oriented x 2 no focal deficits Extremities: Symmetric 5 x 5 power. Skin: No rashes, lesions or ulcers Psychiatry: Judgement and insight appear normal. Mood & affect appropriate.     Data Reviewed: I have personally reviewed following labs and imaging studies  CBC: Recent Labs  Lab 03/25/22 1248  WBC 3.5*  HGB 9.8*  HCT 30.4*  MCV 100.0  PLT [id number]*   Basic Metabolic Panel: Recent Labs  Lab 03/25/22 1248 03/26/22 0446 03/27/22 0404  NA 139 138  --   K 4.1 3.8 3.1*  CL 106 105  --   CO2 26 26  --   GLUCOSE 111* 83  --   BUN 21 18  --   CREATININE 0.89 0.86 1.05*  CALCIUM 9.0 8.6*  --   MG  --   --  1.4*   GFR: Estimated Creatinine Clearance: 29.5 mL/min (A) (by C-G formula based on SCr of 1.05 mg/dL (H)). Liver Function Tests: No results for input(s): "AST", "ALT", "ALKPHOS", "BILITOT", "PROT", "ALBUMIN" in the last 168 hours. No results for input(s): "LIPASE", "AMYLASE" in the last 168 hours. No results for input(s): "AMMONIA" in the last 168 hours. Coagulation Profile: No results for input(s): "INR", "PROTIME" in the last 168 hours. Cardiac Enzymes: No results for input(s): "CKTOTAL", "CKMB",  "CKMBINDEX", "TROPONINI" in the last 168 hours. BNP (last 3 results) No results for input(s): "PROBNP" in the last 8760 hours. HbA1C: No results for input(s): "HGBA1C" in the last 72 hours. CBG: No results for input(s): "GLUCAP" in the last 168 hours. Lipid Profile: No results for input(s): "CHOL", "HDL", "LDLCALC", "TRIG", "CHOLHDL", "LDLDIRECT" in the last 72 hours. Thyroid Function Tests: No results for input(s): "TSH", "T4TOTAL", "FREET4", "T3FREE", "THYROIDAB" in the last 72 hours. Anemia Panel: No results for input(s): "VITAMINB12", "FOLATE", "FERRITIN", "TIBC", "IRON", "RETICCTPCT" in the last 72 hours. Sepsis Labs: Recent Labs  Lab 03/25/22 1539 03/25/22 1701  PROCALCITON  --  <0.10  LATICACIDVEN 1.4 1.5    Recent Results (from the past 240 hour(s))  Blood culture (routine x 2)     Status: None (Preliminary result)   Collection Time: 03/25/22  3:24 PM   Specimen: BLOOD  Result Value Ref Range Status   Specimen Description BLOOD RIGHT ARM  Final   Special Requests   Final    BOTTLES DRAWN AEROBIC AND ANAEROBIC Blood Culture results may not be optimal due to an inadequate volume of blood received in culture bottles   Culture   Final  NO GROWTH 2 DAYS Performed at [facility], [address] [place], [place] [address]    Report Status PENDING  Incomplete  Blood culture (routine x 2)     Status: None (Preliminary result)   Collection Time: 03/25/22  3:47 PM   Specimen: BLOOD  Result Value Ref Range Status   Specimen Description BLOOD RIGHT ARM  Final   Special Requests   Final    BOTTLES DRAWN AEROBIC AND ANAEROBIC Blood Culture results may not be optimal due to an inadequate volume of blood received in culture bottles   Culture   Final    NO GROWTH 2 DAYS Performed at [facility], [address] [place], [place] [address]    Report Status PENDING  Incomplete  MRSA Next Gen by PCR, Nasal     Status: None   Collection Time: 03/26/22  5:20  PM   Specimen: Nasal Mucosa; Nasal Swab  Result Value Ref Range Status   MRSA by PCR Next Gen NOT DETECTED NOT DETECTED Final    Comment: (NOTE) The GeneXpert MRSA Assay (FDA approved for NASAL specimens only), is one component of a comprehensive MRSA colonization surveillance program. It is not intended to diagnose MRSA infection nor to guide or monitor treatment for MRSA infections. Test performance is not FDA approved in patients less than 55 years old. Performed at [facility], [address] [place], [place] [address]          Radiology Studies: [place] CHEST (PLEURAL EFFUSION)  Result Date: 03/26/2022 CLINICAL DATA:  Pleural effusion EXAM: CHEST ULTRASOUND COMPARISON:  None Available. FINDINGS: Focused sonographic exam of the right and left chest was performed for fluid assessment. Images of the right chest demonstrate a small and loculated pleural effusion. Images of the left foot demonstrate a moderate sized effusion with free fluid. IMPRESSION: 1. Moderate left pleural effusion with free fluid, amenable to image guided thoracentesis if desired. 2. Small and loculated right pleural effusion not amenable to image guided thoracentesis. 3. Patient declined thoracentesis at the time of this examination. Electronically Signed   By: Albin Felling M.D.   On: 03/26/2022 16:18   ECHOCARDIOGRAM COMPLETE  Result Date: 03/26/2022    ECHOCARDIOGRAM REPORT   Patient Name:   ZIVAH BARNHILL [facility] Date of Exam: 03/26/2022 Medical Rec #:  [id number]                  Height:       64.0 in Accession #:    [id number]                 Weight:       139.3 lb Date of Birth:  [birth year]                 BSA:          1.678 m Patient Age:    51 years                   BP:           182/80 mmHg Patient Gender: F                          HR:           79 bpm. Exam Location:  [facility] Procedure: 2D Echo, Cardiac Doppler and Color Doppler Indications:     CHF-acute diastolic [id number]  History:  Patient has no prior history of Echocardiogram examinations.                  Risk Factors:Hypertension.  Sonographer:     Sherrie Sport Referring Phys:  [id number] MICHAEL E NORINS Diagnosing Phys: Nelva Bush MD  Sonographer Comments: Image acquisition challenging due to patient body habitus. IMPRESSIONS  1. Left ventricular ejection fraction, by estimation, is 50 to 55%. The left ventricle has low normal function. The left ventricle has no regional wall motion abnormalities. There is moderate left ventricular hypertrophy. Left ventricular diastolic function could not be evaluated.  2. Right ventricular systolic function is normal. The right ventricular size is normal. Mildly increased right ventricular wall thickness. There is severely elevated pulmonary artery systolic pressure. The estimated right ventricular systolic pressure is [id number] mmHg.  3. Left atrial size was mildly dilated.  4. Moderate pleural effusion in the left lateral region.  5. The mitral valve is degenerative. Severe mitral valve regurgitation. No evidence of mitral stenosis. Severe mitral annular calcification.  6. Tricuspid valve regurgitation is severe.  7. The aortic valve has an indeterminant number of cusps. There is moderate calcification of the aortic valve. There is severe thickening of the aortic valve. Aortic valve regurgitation is not visualized. Moderate aortic valve stenosis. Aortic valve area, by VTI measures 0.88 cm. Aortic valve mean gradient measures 17.0 mmHg.  8. The inferior vena cava is dilated in size with <50% respiratory variability, suggesting right atrial pressure of 15 mmHg. FINDINGS  Left Ventricle: Left ventricular ejection fraction, by estimation, is 50 to 55%. The left ventricle has low normal function. The left ventricle has no regional wall motion abnormalities. The left ventricular internal cavity size was normal in size. There is moderate left ventricular hypertrophy. Left ventricular diastolic function could not  be evaluated due to mitral annular calcification (moderate or greater). Left ventricular diastolic function could not be evaluated. Right Ventricle: The right ventricular size is normal. Mildly increased right ventricular wall thickness. Right ventricular systolic function is normal. There is severely elevated pulmonary artery systolic pressure. The tricuspid regurgitant velocity is 4.47 m/s, and with an assumed right atrial pressure of 15 mmHg, the estimated right ventricular systolic pressure is [id number] mmHg. Left Atrium: Left atrial size was mildly dilated. Right Atrium: Right atrial size was normal in size. Pericardium: There is no evidence of pericardial effusion. Mitral Valve: The mitral valve is degenerative in appearance. There is mild thickening of the mitral valve leaflet(s). Severe mitral annular calcification. Severe mitral valve regurgitation. No evidence of mitral valve stenosis. MV peak gradient, 6.5 mmHg. The mean mitral valve gradient is 3.0 mmHg. Tricuspid Valve: The tricuspid valve is not well visualized. Tricuspid valve regurgitation is severe. Aortic Valve: The aortic valve has an indeterminant number of cusps. There is moderate calcification of the aortic valve. There is severe thickening of the aortic valve. Aortic valve regurgitation is not visualized. Moderate aortic stenosis is present. Aortic valve mean gradient measures 17.0 mmHg. Aortic valve peak gradient measures 24.4 mmHg. Aortic valve area, by VTI measures 0.88 cm. Pulmonic Valve: The pulmonic valve was not well visualized. Pulmonic valve regurgitation is mild. No evidence of pulmonic stenosis. Aorta: The aortic root is normal in size and structure. Pulmonary Artery: The pulmonary artery is not well seen. Venous: The inferior vena cava is dilated in size with less than 50% respiratory variability, suggesting right atrial pressure of 15 mmHg. IAS/Shunts: The interatrial septum was not well visualized. Additional Comments: There is a  moderate  pleural effusion in the left lateral region.  LEFT VENTRICLE PLAX 2D LVIDd:         3.80 cm   Diastology LVIDs:         2.80 cm   LV e' medial:    5.44 cm/s LV PW:         1.50 cm   LV E/e' medial:  16.4 LV IVS:        1.50 cm   LV e' lateral:   6.53 cm/s LVOT diam:     1.90 cm   LV E/e' lateral: 13.6 LV SV:         47 LV SV Index:   28 LVOT Area:     2.84 cm  RIGHT VENTRICLE RV Basal diam:  3.50 cm RV Mid diam:    3.20 cm RV S prime:     12.80 cm/s TAPSE (M-mode): 2.6 cm LEFT ATRIUM             Index        RIGHT ATRIUM           Index LA diam:        4.10 cm 2.44 cm/m   RA Area:     13.70 cm LA Vol (A2C):   70.6 ml 42.08 ml/m  RA Volume:   33.30 ml  19.85 ml/m LA Vol (A4C):   51.7 ml 30.81 ml/m LA Biplane Vol: 63.4 ml 37.79 ml/m  AORTIC VALVE AV Area (Vmax):    0.84 cm AV Area (Vmean):   0.83 cm AV Area (VTI):     0.88 cm AV Vmax:           247.20 cm/s AV Vmean:          177.200 cm/s AV VTI:            0.533 m AV Peak Grad:      24.4 mmHg AV Mean Grad:      17.0 mmHg LVOT Vmax:         73.40 cm/s LVOT Vmean:        51.900 cm/s LVOT VTI:          0.166 m LVOT/AV VTI ratio: 0.31  AORTA Ao Root diam: 2.30 cm MITRAL VALVE               TRICUSPID VALVE MV Area (PHT): 4.41 cm    TR Peak grad:   79.9 mmHg MV Area VTI:   1.62 cm    TR Vmax:        447.00 cm/s MV Peak grad:  6.5 mmHg MV Mean grad:  3.0 mmHg    SHUNTS MV Vmax:       1.27 m/s    Systemic VTI:  0.17 m MV Vmean:      78.6 cm/s   Systemic Diam: 1.90 cm MV Decel Time: 172 msec MV E velocity: 89.10 cm/s MV A velocity: 96.40 cm/s MV E/A ratio:  0.92 Harrell Gave End MD Electronically signed by Nelva Bush MD Signature Date/Time: 03/26/2022/4:00:38 PM    Final    CT Angio Chest Pulmonary Embolism (PE) W or WO Contrast  Addendum Date: 03/25/2022   ADDENDUM REPORT: 03/25/2022 18:43 ADDENDUM: Effusion sizes are moderate on the LEFT with a larger sub pulmonic component just over the LEFT hemidiaphragm and small to moderate on the RIGHT with  loculated areas as discussed. These results were called by telephone at the time of interpretation on 03/25/2022 at 6:42 pm to provider Dr. Nickolas Madrid, Who  verbally acknowledged these results. Electronically Signed   By: Zetta Bills M.D.   On: 03/25/2022 18:43   Result Date: 03/25/2022 CLINICAL DATA:  89 year old female presents for evaluation of suspected pulmonary embolism with shortness of breath and bilateral leg swelling. History of uterine and breast cancer. * Tracking Code: BO * EXAM: CT ANGIOGRAPHY CHEST WITH CONTRAST TECHNIQUE: Multidetector CT imaging of the chest was performed using the standard protocol during bolus administration of intravenous contrast. Multiplanar CT image reconstructions and MIPs were obtained to evaluate the vascular anatomy. RADIATION DOSE REDUCTION: This exam was performed according to the departmental dose-optimization program which includes automated exposure control, adjustment of the mA and/or kV according to patient size and/or use of iterative reconstruction technique. CONTRAST:  22m OMNIPAQUE IOHEXOL 350 MG/ML SOLN COMPARISON:  None available FINDINGS: Cardiovascular: Calcified aortic atherosclerotic changes. Heart size moderately enlarged. Central pulmonary arteries are opacified to reported 83 Hounsfield units. There is no sign of venous thromboembolism in the chest. Sequela of prior cement embolism present in the RIGHT lower lobe pulmonary artery without occlusive features or signs of surrounding inflammation. Mediastinum/Nodes: Esophagus grossly normal. No discrete adenopathy in the chest. Soft tissue thickening over the RIGHT neck with signs of nodularity overlying the sternocleidomastoid muscle. Much of this could be due to presence of venous collaterals about the RIGHT neck and upper chest though this is not clear. Question of fullness of subcarinal nodal tissue and RIGHT paratracheal nodal tissue but these areas are difficult to assess due to dense contrast in the  pulmonary vascular bed and in the SVC. No frank hilar nodal tissue with enlargement. Lungs/Pleura: Loculated pleural fluid throughout the chest with scalloped margins. No discrete pleural nodule is visible on this very early phase examination. Density of pleural fluid is low. Pleural fluid is loculated in the major fissure in the RIGHT chest tracking around the anterior RIGHT chest towards the level of the mid chest and seen dependently tracking posterior to the RIGHT heart and over the RIGHT hemidiaphragm. There is sub pulmonic fluid in the LEFT chest with volume loss in the LEFT lower lobe. Signs of septal thickening throughout the chest greatest at the lung bases where there is the greatest amount of pleural fluid. Small nodules at the LEFT lung base measuring 4 mm on image 92 and 93 respectively. Airways are patent. LEFT basilar consolidation and patchy areas of tree in bud. Upper Abdomen: Incidental imaging of upper abdominal contents shows no acute process to the extent that can be evaluated on this chest CT based on timing of the bolus and artifact. Musculoskeletal: Spinal nerve stimulator in place. No chest wall mass.  Spinal degenerative changes and osteopenia. Review of the MIP images confirms the above findings. IMPRESSION: 1. No sign of venous thromboembolism in the chest. 2. 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 3. Loculated pleural fluid throughout the chest with scalloped margins. No discrete pleural nodule is visible on this very early phase examination. Given patient history of neoplasm this raises concern for malignant effusions. Effusions related to heart failure or volume overload would not be expected to have this loculated appearance. Post infectious pleural fluid would likely have greater enhancement. When viewed in the sagittal plane in the RIGHT chest there is frank nodularity though  measuring lower density than expected for soft tissue along the major and minor fissures. 4. Patchy areas of ground-glass and septal thickening at the lung bases along  with tree-in-bud nodularity could reflect mild pneumonitis. Would also correlate with signs of heart failure. In the current context follow-up will be helpful to ensure no signs of lymphangitic tumor. 5. LEFT basilar consolidation and patchy areas of tree in bud. Findings are concerning for pneumonia. 6. Soft tissue thickening over the RIGHT neck with signs of nodularity overlying the sternocleidomastoid muscle. Much of this could be due to presence of venous collaterals about the RIGHT neck and upper chest though this is not clear. Follow-up ultrasound may be helpful for further evaluation of this area. 7. Small nodules at the LEFT lung base measuring 4 mm respectively. Nonspecific findings, suggest attention on follow-up. 8. Aortic atherosclerosis. Aortic Atherosclerosis (ICD10-I70.0). Electronically Signed: By: Zetta Bills M.D. 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 Infarct would be felt unlikely given chronicity of cement embolization unless there was recent cement augmentation. On the lateral projection convex margins seen in the area of the RIGHT middle lobe raise the question of underlying mass. CT of the chest is suggested for further evaluation. Dense consolidation at the LEFT lower lobe and other airspace disease in the RIGHT chest concerning for pneumonia. These results were called by telephone at the time of interpretation on 03/25/2022 at 1:36 pm to provider Dr Joni Fears , who verbally acknowledged these results. Electronically Signed   By: Zetta Bills M.D.   On: 03/25/2022 13:36   Result Date: 03/25/2022 CLINICAL DATA:  Chest pain shortness of breath. EXAM: CHEST - 2 VIEW COMPARISON:  March 18, 2016.  No recent  chest imaging. FINDINGS: Trachea is midline. Cardiomediastinal contours and hilar structures with mild cardiac enlargement. Dense tubular material at the RIGHT hilum new since previous imaging, interval kyphoplasty. Dense consolidation at the LEFT lung base is noted with small LEFT-sided pleural effusion. Small to moderate RIGHT-sided effusion with ovoid opacity in the RIGHT mid chest potentially loculated fluid or airspace process in the RIGHT mid chest peripheral areas of added density about the RIGHT hilum. Signs of spinal nerve stimulator in place, tips over the midthoracic spine. Faintly visualized changes recent kyphoplasty in upper lumbar spine. Osteopenia without acute skeletal process. IMPRESSION: 1. Findings suspicious for bilateral pneumonia or aspiration with small effusions. Would also correlate with signs heart failure. 2. Suspected cement embolization to the RIGHT lung. Ovoid opacity in the RIGHT chest could reflect pneumonia or infarct or loculated pleural fluid within the major fissure at the peripheral lung. CT may be helpful for further evaluation. 3. Suggest follow-up to ensure resolution of above findings. Electronically Signed: By: Zetta Bills M.D. On: 03/25/2022 13:20        Scheduled Meds:  amLODipine  5 mg Oral Daily   furosemide  40 mg Intravenous Q12H   heparin  5,000 Units Subcutaneous Q8H   lidocaine (PF)  10 mL Intradermal Once   losartan  25 mg Oral Daily   metoprolol tartrate  25 mg Oral BID   polyvinyl alcohol  1 drop Both Eyes Daily   senna  1 tablet Oral BID   Continuous Infusions:  levofloxacin (LEVAQUIN) IV       LOS: 2 days    Sidney Ace, MD [facility]   If 7PM-7AM, please contact night-coverage  03/27/2022, 11:20 AM

## 2022-03-27 NOTE — Progress Notes (Addendum)
Around 9pm pt was taken to bathroom stating she needed to have a bowel movement. After pt was assisted to bathroom, she became unresponsive. After being assisted back to bed by staff, vitals were stable and pulses were palpable. Neomia Glass, NP on call came to assess pt. Order to hold lasix and metoprolol dose for the night, and order for EKG was also placed. Pt is alert and oriented and states she feels better. DNR bracelet placed.

## 2022-03-28 ENCOUNTER — Inpatient Hospital Stay: Payer: Medicare Other

## 2022-03-28 DIAGNOSIS — I159 Secondary hypertension, unspecified: Secondary | ICD-10-CM | POA: Diagnosis not present

## 2022-03-28 DIAGNOSIS — Z7189 Other specified counseling: Secondary | ICD-10-CM | POA: Diagnosis not present

## 2022-03-28 DIAGNOSIS — I5033 Acute on chronic diastolic (congestive) heart failure: Secondary | ICD-10-CM | POA: Diagnosis not present

## 2022-03-28 DIAGNOSIS — J90 Pleural effusion, not elsewhere classified: Secondary | ICD-10-CM | POA: Diagnosis not present

## 2022-03-28 DIAGNOSIS — Z66 Do not resuscitate: Secondary | ICD-10-CM

## 2022-03-28 DIAGNOSIS — R0602 Shortness of breath: Secondary | ICD-10-CM

## 2022-03-28 DIAGNOSIS — I951 Orthostatic hypotension: Secondary | ICD-10-CM

## 2022-03-28 LAB — CBC WITH DIFFERENTIAL/PLATELET
Abs Immature Granulocytes: 0.01 10*3/uL (ref 0.00–0.07)
Basophils Absolute: 0 10*3/uL (ref 0.0–0.1)
Basophils Relative: 1 %
Eosinophils Absolute: 0.1 10*3/uL (ref 0.0–0.5)
Eosinophils Relative: 2 %
HCT: 28.5 % — ABNORMAL LOW (ref 36.0–46.0)
Hemoglobin: 9.3 g/dL — ABNORMAL LOW (ref 12.0–15.0)
Immature Granulocytes: 0 %
Lymphocytes Relative: 49 %
Lymphs Abs: 1.7 10*3/uL (ref 0.7–4.0)
MCH: 31.5 pg (ref 26.0–34.0)
MCHC: 32.6 g/dL (ref 30.0–36.0)
MCV: 96.6 fL (ref 80.0–100.0)
Monocytes Absolute: 0.2 10*3/uL (ref 0.1–1.0)
Monocytes Relative: 7 %
Neutro Abs: 1.4 10*3/uL — ABNORMAL LOW (ref 1.7–7.7)
Neutrophils Relative %: 41 %
Platelets: 139 10*3/uL — ABNORMAL LOW (ref 150–400)
RBC: 2.95 MIL/uL — ABNORMAL LOW (ref 3.87–5.11)
RDW: 15.9 % — ABNORMAL HIGH (ref 11.5–15.5)
WBC: 3.4 10*3/uL — ABNORMAL LOW (ref 4.0–10.5)
nRBC: 0 % (ref 0.0–0.2)

## 2022-03-28 LAB — BASIC METABOLIC PANEL
Anion gap: 8 (ref 5–15)
BUN: 23 mg/dL (ref 8–23)
CO2: 29 mmol/L (ref 22–32)
Calcium: 8.9 mg/dL (ref 8.9–10.3)
Chloride: 101 mmol/L (ref 98–111)
Creatinine, Ser: 0.99 mg/dL (ref 0.44–1.00)
GFR, Estimated: 53 mL/min — ABNORMAL LOW (ref >60)
Glucose, Bld: 119 mg/dL — ABNORMAL HIGH (ref 70–99)
Potassium: 3.9 mmol/L (ref 3.5–5.1)
Sodium: 138 mmol/L (ref 135–145)

## 2022-03-28 LAB — MAGNESIUM: Magnesium: 1.8 mg/dL (ref 1.7–2.4)

## 2022-03-28 MED ORDER — FUROSEMIDE 10 MG/ML IJ SOLN
40.0000 mg | Freq: Every day | INTRAMUSCULAR | Status: DC
  Administered 2022-03-29: 40 mg via INTRAVENOUS
  Filled 2022-03-28: qty 4

## 2022-03-28 MED ORDER — MAGNESIUM SULFATE 2 GM/50ML IV SOLN
2.0000 g | Freq: Once | INTRAVENOUS | Status: AC
  Administered 2022-03-28: 2 g via INTRAVENOUS
  Filled 2022-03-28: qty 50

## 2022-03-28 NOTE — Evaluation (Signed)
Physical Therapy Evaluation Patient Details Name: Brandi Reid MRN: [id number] DOB: May 10, 1927 Today's Date: 03/28/2022  History of Present Illness  Brandi Reid, a 89 y/o independent woman with h/o breast cancer and uterine cancer remotely reports progressive SOB/DOE and increasing weakness over the past month or more. Her last OV PCP 01/31/22 she was stable and doing well. Other medical problems include HTN, OA knees, GERD, chronic anemia, spinal stenosis, h/o lumbar compression fracture s/p kyphoplasty, chronic back pain with failed implanted stimulator. She now presents due to severe SOB and weakness. Patient is s/p thoracentesis 03/27/22.   Clinical Impression  Patient received in bed, she is agreeable to PT assessment. Patient is HOH, but alert and oriented. Patient is mod I with bed mobility. Stands with min guard. She is orthostatic with change in position. Reports feeling weak mostly. Patient is able to ambulate with RW and min guard. No lob. She will continue to benefit from skilled PT to improve strength, functional independence and safety.         Recommendations for follow up therapy are one component of a multi-disciplinary discharge planning process, led by the attending physician.  Recommendations may be updated based on patient status, additional functional criteria and insurance authorization.  Follow Up Recommendations Home health PT      Assistance Recommended at Discharge Intermittent Supervision/Assistance  Patient can return home with the following  A little help with walking and/or transfers;A little help with bathing/dressing/bathroom;Assist for transportation;Assistance with cooking/housework;Help with stairs or ramp for entrance    Equipment Recommendations None recommended by PT  Recommendations for Other Services       Functional Status Assessment Patient has had a recent decline in their functional status and demonstrates the ability to make  significant improvements in function in a reasonable and predictable amount of time.     Precautions / Restrictions Precautions Precautions: Fall Restrictions Weight Bearing Restrictions: No Other Position/Activity Restrictions: orthostatic      Mobility  Bed Mobility Overal bed mobility: Modified Independent             General bed mobility comments: comes to sitting edge of bed quite easily    Transfers Overall transfer level: Needs assistance Equipment used: Rolling walker (2 wheels) Transfers: Sit to/from Stand Sit to Stand: Min guard           General transfer comment: patient is orthostatic with standing. Systolic went from 0000000 to 115 with standing.    Ambulation/Gait Ambulation/Gait assistance: Min guard Gait Distance (Feet): 25 Feet Assistive device: Rolling walker (2 wheels) Gait Pattern/deviations: Step-through pattern, Decreased stride length Gait velocity: decreased     General Gait Details: patient without LOB, reports feeling weak.  Stairs            Wheelchair Mobility    Modified Rankin (Stroke Patients Only)       Balance Overall balance assessment: Modified Independent                                           Pertinent Vitals/Pain Pain Assessment Pain Assessment: No/denies pain    Home Living Family/patient expects to be discharged to:: Private residence Living Arrangements: Alone Available Help at Discharge: Family;Available PRN/intermittently Type of Home: House Home Access: Stairs to enter   Entrance Stairs-Number of Steps: 1   Home Layout: One level Home Equipment: Conservation officer, nature (2 wheels);Grab bars - tub/shower;Cane -  single point      Prior Function Prior Level of Function : Independent/Modified Independent;Working/employed;Driving             Mobility Comments: patient has been using walker at work for the last couple of weeks. Also has cane she would use occasionally. 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 ADLs Comments: mod I     Hand Dominance        Extremity/Trunk Assessment   Upper Extremity Assessment Upper Extremity Assessment: Defer to OT evaluation    Lower Extremity Assessment Lower Extremity Assessment: Generalized weakness    Cervical / Trunk Assessment Cervical / Trunk Assessment: Normal  Communication   Communication: HOH  Cognition Arousal/Alertness: Awake/alert Behavior During Therapy: WFL for tasks assessed/performed Overall Cognitive Status: Within Functional Limits for tasks assessed                                          General Comments      Exercises     Assessment/Plan    PT Assessment Patient needs continued PT services  PT Problem List Decreased strength;Decreased activity tolerance;Decreased balance;Decreased mobility       PT Treatment Interventions Gait training;Therapeutic exercise;Balance training;Stair training;Functional mobility training;Therapeutic activities;Patient/family education    PT Goals (Current goals can be found in the Care Plan section)  Acute Rehab PT Goals Patient Stated Goal: to return home PT Goal Formulation: With patient Time For Goal Achievement: 04/12/22 Potential to Achieve Goals: Good    Frequency Min 2X/week     Co-evaluation               AM-PAC PT "6 Clicks" Mobility  Outcome Measure Help needed turning from your back to your side while in a flat bed without using bedrails?: A Little Help needed moving from lying on your back to sitting on the side of a flat bed without using bedrails?: A Little Help needed moving to and from a bed to a chair (including a wheelchair)?: A Little Help needed standing up from a chair using your arms (e.g., wheelchair or bedside chair)?: A Little Help needed to walk in hospital room?: A Little Help needed climbing 3-5 steps with a railing? : A Little 6 Click Score: 18    End of Session  Equipment Utilized During Treatment: Gait belt;Oxygen Activity Tolerance: Patient limited by fatigue Patient left: in chair;with call bell/phone within reach;with family/visitor present Nurse Communication: Mobility status PT Visit Diagnosis: Other abnormalities of gait and mobility (R26.89);Muscle weakness (generalized) (M62.81);Difficulty in walking, not elsewhere classified (R26.2);Dizziness and giddiness (R42)    Time: [id number] PT Time Calculation (min) (ACUTE ONLY): 20 min   Charges:   PT Evaluation $PT Eval Moderate Complexity: 1 Mod          Ameris Akamine, PT, GCS 03/28/22,12:43 PM

## 2022-03-28 NOTE — Consult Note (Signed)
Consultation Note Date: 03/28/2022   Patient Name: Brandi Reid  DOB: 07/01/1927  MRN: [id number]  Age / Sex: 89 y.o., female  PCP: Dayton Martes Victoriano Lain, NP Referring Physician: Kerney Elbe, DO  Reason for Consultation: Establishing goals of care  HPI/Patient Profile: 89 y/o independent woman with h/o breast cancer and uterine cancer remotely reports progressive SOB/DOE and increasing weakness over the past month or more. Her last OV PCP 01/31/22 she was stable and doing well. Other medical problems include HTN, OA knees, GERD, chronic anemia, spinal stenosis, h/o lumbar compression fracture s/p kyphoplasty, chronic back pain with failed implanted stimulator. She now presents due to severe SOB and weakness. She denies fever, chills, productive cough, chest pain, any GI symptoms.     Clinical Assessment and Goals of Care: Notes and labs reviewed.  In to see patient.  She denies complaint at this time.  Patient states at baseline she lives alone and is widowed.  She states she has a living son, and a living daughter, and has both her deceased son and daughter.  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  She states she works in their office and helps manage their accounts and their books.  She is fully independent and continues to drive.  She states she has been using a walker for about the past month due to feeling weak.  We discussed her diagnosis, prognosis, GOC, EOL wishes disposition and options.  Created space and opportunity for patient  to explore thoughts and feelings regarding current medical information.   A detailed discussion was had today regarding advanced directives.  Concepts specific to code status, artifical feeding and hydration, IV antibiotics and rehospitalization were discussed.  The difference between an aggressive medical  intervention path and a comfort care path was discussed.  Values and goals of care important to patient and family were attempted to be elicited.  Discussed limitations of medical interventions to prolong quality of life in some situations and discussed the concept of human mortality.  Patient confirms DNR/DNI status.  She states she does not want to be a burden on her family, and would like to continue with a productive quality of life.  She states if she does indeed have cancer, she would be amenable to radiation but would not want to have chemotherapy again.  She states if she were to need chemotherapy again, she would want comfort focused care with hospice.  She states that her 2 children would be her surrogate decision makers, and she would want them to make decisions together.  She states she has an advanced directive, but is unsure of how the H POA portion is set out.    SUMMARY OF RECOMMENDATIONS   DNR/DNI. She states if she were to have cancer she would be amenable to radiation but not chemotherapy.  If she were to require chemotherapy, she would want comfort focused care with hospice. Patient advises her 2 children would be her surrogate decision makers.  Prognosis:  Unable to determine       Primary Diagnoses: Present on Admission:  SOB (shortness of breath)  Loculated pleural effusion  HTN (hypertension)  GERD (gastroesophageal reflux disease)   I have reviewed the medical record, interviewed the patient and family, and examined the patient. The following aspects are pertinent.  Past Medical History:  Diagnosis Date   Anemia    Breast cancer ([place]) 2005   left positive   Cancer ([place]) 2005   lt lumpectomy/chemo and rad   Complication of anesthesia    pt states she broke out in a sweat and felt like she was going to pass out during a dental procedure-no problems with surgeries though   GERD (gastroesophageal reflux disease)    History of hiatal hernia     Hypertension    Osteoarthritis, knee    Spinal stenosis    Uterine cancer [facility])    Social History   Socioeconomic History   Marital status: Widowed    Spouse name: Not on file   Number of children: Not on file   Years of education: Not on file   Highest education level: Not on file  Occupational History   Not on file  Tobacco Use   Smoking status: Never   Smokeless tobacco: Never  Vaping Use   Vaping Use: Never used  Substance and Sexual Activity   Alcohol use: No   Drug use: No   Sexual activity: Not on file  Other Topics Concern   Not on file  Social History Narrative   Not on file   Social Determinants of Health   Financial Resource Strain: Not on file  Food Insecurity: No Food Insecurity (03/26/2022)   Hunger Vital Sign    Worried About Running Out of Food in the Last Year: Never true    Ran Out of Food in the Last Year: Never true  Transportation Needs: No Transportation Needs (03/26/2022)   PRAPARE - Hydrologist (Medical): No    Lack of Transportation (Non-Medical): No  Physical Activity: Not on file  Stress: Not on file  Social Connections: Not on file   Family History  Problem Relation Age of Onset   Breast cancer Daughter 1   Scheduled Meds:  amLODipine  5 mg Oral Daily   furosemide  40 mg Intravenous Q12H   heparin  5,000 Units Subcutaneous Q8H   lidocaine (PF)  10 mL Intradermal Once   losartan  25 mg Oral Daily   metoprolol tartrate  25 mg Oral BID   polyvinyl alcohol  1 drop Both Eyes Daily   potassium chloride  40 mEq Oral BID   senna  1 tablet Oral BID   Continuous Infusions:  levofloxacin (LEVAQUIN) IV 100 mL/hr at 03/27/22 1736   PRN Meds:.acetaminophen **OR** acetaminophen Medications Prior to Admission:  Prior to Admission medications   Medication Sig Start Date End Date Taking? Authorizing Provider  ADVIL DUAL ACTION 125-250 MG TABS Take 2 tablets by mouth every 8 (eight) hours as needed (pain.).   Yes  [provider]  cholecalciferol (VITAMIN D) 25 MCG (1000 UT) tablet Take 1,000 Units by mouth every evening.   Yes [provider]  hydrochlorothiazide (HYDRODIURIL) 25 MG tablet Take 12.5 mg by mouth daily as needed.   Yes [provider]  metoprolol tartrate (LOPRESSOR) 50 MG tablet Take 25 mg by mouth 2 (two) times daily.   Yes [provider]  Multiple Vitamins-Minerals (PRESERVISION AREDS 2  PO) Take 1 tablet by mouth 2 (two) times daily.   Yes [provider]  Polyethyl Glycol-Propyl Glycol (LUBRICANT EYE DROPS) 0.4-0.3 % SOLN Place 1 drop into both eyes daily.   Yes [provider]  HYDROcodone-acetaminophen (NORCO) 5-325 MG tablet Take 1 tablet by mouth every 6 (six) hours as needed. Patient not taking: Reported on 03/25/2022 10/30/18   Hessie Knows, MD   Allergies  Allergen Reactions   Compazine [Prochlorperazine] Anaphylaxis   Penicillins Anaphylaxis    Did it involve swelling of the face/tongue/throat, SOB, or low BP? Yes Did it involve sudden or severe rash/hives, skin peeling, or any reaction on the inside of your mouth or nose? No Did you need to seek medical attention at a hospital or doctor's office? Yes When did it last happen? More than 30-40 years ago If all above answers are "NO", may proceed with cephalosporin use.    Review of Systems  All other systems reviewed and are negative.   Physical Exam Pulmonary:     Effort: Pulmonary effort is normal.  Skin:    General: Skin is warm and dry.  Neurological:     Mental Status: She is alert.     Vital Signs: BP (!) 155/61 (BP Location: Right Arm)   Pulse 72   Temp 98.5 F (36.9 C) (Oral)   Resp 19   Ht '5\' 4"'$  (1.626 m)   Wt 61.3 kg   SpO2 95%   BMI 23.20 kg/m  Pain Scale: 0-10   Pain Score: 0-No pain   SpO2: SpO2: 95 % O2 Device:SpO2: 95 % O2 Flow Rate: .O2 Flow Rate (L/min): 2 L/min  IO: Intake/output summary:  Intake/Output Summary (Last 24 hours)  at 03/28/2022 1150 Last data filed at 03/28/2022 1000 Gross per 24 hour  Intake 304.13 ml  Output 165 ml  Net 139.13 ml    LBM: Last BM Date : 03/27/22 Baseline Weight: Weight: 56.7 kg Most recent weight: Weight: 61.3 kg      Signed by: Asencion Gowda, NP   Please contact Palliative Medicine Team phone at [phone] for questions and concerns.  For individual provider: See Shea Evans

## 2022-03-28 NOTE — Progress Notes (Signed)
PROGRESS NOTE    Stasi Landmark  [id number] DOB: 1927-10-07 DOA: 03/25/2022 PCP: Sallee Lange, NP   Brief Narrative:  The patient is a 89 year old elderly Caucasian female with a history of breast cancer as well as uterine cancer along with other comorbidities who presented with progressively worsening shortness of breath and dyspnea on exertion as well as increased weakness over the past month or so.  At her last outpatient PCP visit she was stable and doing well otherwise but came to the hospital given her weakness.  She does have a past medical history significant for but limited to hypertension, osteoarthritis, GERD, chronic anemia, spinal stenosis, lumbar compression fracture status post kyphoplasty, chronic back pain with failed implanted stimulator as well as other comorbidities who presented with severe shortness of breath and weakness.  Further workup was done and she had a loculated pleural effusion done in the was ordered and done.  Cardiology and palliative care medicine were consulted for further evaluation management.  Patient appeared volume overloaded so cardiology started on diuresis but then she became lightheaded and dizzy and likely orthostatic so diuresis was cut back and some of her blood pressure medications were held.  An abdominal binder was ordered and she was given back a little bit of fluids given her dizziness.  Given that she continued to be lightheaded and dizzy CT scan was done and showed no acute intracranial abnormality.  She is slowly improving and PT OT recommending home health.  Assessment and Plan: *Acute respiratory failure with hypoxia along in the setting of loculated pleural effusion -Loculated pleural effusion of unclear etiology.   -Unable to totally exclude infection but seems less likely at this time.  -There is some nodularity which may suggest malignancy given remote history of cancer. Also has some pulmonary edema complicating  the situation.  Patient with a very complex oncologic history including breast and uterine cancer as well as a history of smoking in the past. -CT scan of the chest done and showed "No sign of venous thromboembolism in the chest. 2. 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 3. Loculated pleural fluid throughout the chest with scalloped margins. No discrete pleural nodule is visible on this very early phase examination. Given patient history of neoplasm this raises concern for malignant effusions. Effusions related to heart failure or volume overload would not be expected to have this loculated appearance. Post infectious pleural fluid would likely have greater enhancement. When viewed in the sagittal plane in the RIGHT chest there is frank nodularity though measuring lower density than expected for soft tissue along the major and minor fissures. 4. Patchy areas of ground-glass and septal thickening at the lung bases along with tree-in-bud nodularity could reflect mild pneumonitis. Would also correlate with signs of heart failure. In the current context follow-up will be helpful to ensure no signs of lymphangitic tumor. 5. LEFT basilar consolidation and patchy areas of tree in bud. Findings are concerning for pneumonia. 6. Soft tissue thickening over the RIGHT neck with signs of nodularity overlying the sternocleidomastoid muscle. Much of this could be due to presence of venous collaterals about the RIGHT neck and upper chest though this is not clear. Follow-up ultrasound may be helpful for further evaluation of this area. 7. Small nodules at the LEFT lung base measuring 4 mm respectively. Nonspecific findings, suggest attention on follow-up. 8. Aortic atherosclerosis." -Patient initially declined to have thoracentesis done on  2/26.  IR reconsulted -IR consulted for thoracentesis and this was  done yet today -Pathology ordered on pleural fluid and showed 474 total nucleated cell count with 83 lymphs and 0 eosinophils, she had 67 LD fluid and culture and Gram stain is pending along with cytology -Hold off on chest tube for now -SpO2: 97 % O2 Flow Rate (L/min): 2 L/min; she has now been weaned to room air -Diuresis was decreased given her orthostatic hypotension; see the Occupational Therapy note for the vital signs documented -PT OT recommending home health   Acute on chronic diastolic CHF (congestive heart failure) ([place]), -Patient w/o prior h/o CHF now with elevated BNP, abnormal CXR and CTA suggestive of pulmonary edema, infection unlikely w/o fever, productive cough or leukocytosis. She endorses progressive SOB/DOE over at least the past month along with peripheral edema. She denies rigors, fever at home, any chest pain.  -BNP was 3,106.5 -Strict I's and O's and Daily Weights  Intake/Output Summary (Last 24 hours) at 03/28/2022 1858 Last data filed at 03/28/2022 1607 Gross per 24 hour  Intake 1458.34 ml  Output 165 ml  Net 1293.34 ml  -Patient is -2.610 Liters since Admission  -2D echocardiogram completed, normal ejection fraction -Continued Lasix 40 mg IV every 12 hours but this was held given her orthostatics today.  Cardiology is going to resume this at a decreased dose of IV Lasix 40 mg once daily -Blood pressure medication with amlodipine was held and cardiology feels that she may need to have her metoprolol and losartan decreased -Strict ins and outs, daily weights -Cardiology consulted for further evaluation and recommendations and appreciate their management   GERD (gastroesophageal reflux disease)/GI prophylaxis -No active symptoms and she takes no medication -Continue Daily PPI for stress ulcer prophylaxis   HTN (hypertension) -Patient has developed HTN in her later years.  She was orthostatic so we will hold her blood pressure medication; cardiology has held her  amlodipine with orthostasis and recommending decreasing losartan or metoprolol to the morning -Now her blood pressure is doing little bit better at 145/58 but she did seem to be dropping became symptomatic on evaluation with PT and OT   Unresponsive Episode -Likely had a Vasovagal Episode on the evening of 03/27/22 -Held PM Lasix and PM Metoprolol last evening  -Check Orthostatics this AM -ECHO Done on 03/26/22 and showed "Left ventricular ejection fraction, by estimation, is 50 to 55%. The left ventricle has low normal function. The left ventricle has no regional  wall motion abnormalities. There is moderate left ventricular hypertrophy.  Left ventricular diastolic function could not be evaluated. Right ventricular systolic function is normal. The right ventricular size is normal. Mildly increased right ventricular wall thickness. There is severely elevated pulmonary artery systolic pressure. The estimated right ventricular systolic pressure is [id number] mmHg. Left atrial size was mildly dilated. Moderate pleural effusion in the left lateral region. The mitral valve is degenerative. Severe mitral valve regurgitation. No evidence of mitral stenosis. Severe mitral annular calcification. Tricuspid valve regurgitation is severe. The aortic valve has an indeterminant number of cusps. There is moderate calcification of the aortic valve. There is severe thickening of the aortic valve. Aortic valve regurgitation is not visualized. Moderate aortic valve stenosis. Aortic valve area, by VTI measures 0.88 cm. Aortic valve mean gradient measures 17.0 mmHg. The inferior vena cava is dilated in size with <50% respiratory variability, suggesting right atrial pressure of 15 mmHg." -She likely did vasovagal and was orthostatic this morning so this is likely caused  so we will stop her diuresis and hold her blood pressure medication, give her a little bit of fluid as well as order TED hose as well as an abdominal binder  Dizziness  and lightheadedness -In the setting of orthostasis -Head CT was obtained and showed no acute intracranial abnormality  Pancytopenia -CBC Trend: Recent Labs  Lab 03/25/22 1248 03/28/22 0529  WBC 3.5* 3.4*  HGB 9.8* 9.3*  HCT 30.4* 28.5*  MCV 100.0 96.6  PLT 112* 139*  -Check Anemia Panel in the AM -Continue to Monitor for S/Sx of Bleeding; No overt bleeding noted -Repeat CBC in the AM   Goals of Care -My colleague Discussed with patient with daughter present. She was very clear about not wanting cardio-pulmonary resuscitation in the event of cardiac arrest or respiratory arrest.  -C/w DNR/DNI and palliative care has been consulted for further goals of care discussion and based on their discussion if the patient were to have cancer she is amenable to radiation but not chemotherapy and if she requires chemotherapy then she would want a full comfort focused care with hospice  DVT prophylaxis: Place TED hose Start: 03/28/22 0828 heparin injection 5,000 Units Start: 03/25/22 2200    Code Status: DNR Family Communication: Patient's niece is at bedside  Disposition Plan:  Level of care: Telemetry Medical Status is: Inpatient Remains inpatient appropriate because: Needs further clinical improvement and clearance by cardiology   Consultants:  Cardiology  Procedures:  Echocardiogram and CTA of the chest  Antimicrobials:  Anti-infectives (From admission, onward)    Start     Dose/Rate Route Frequency Ordered Stop   03/27/22 1830  levofloxacin (LEVAQUIN) IVPB 750 mg        750 mg 100 mL/hr over 90 Minutes Intravenous Every 48 hours 03/26/22 0741     03/26/22 2000  vancomycin (VANCOREADY) IVPB 750 mg/150 mL  Status:  Discontinued        750 mg 150 mL/hr over 60 Minutes Intravenous Every 24 hours 03/26/22 0741 03/27/22 0801   03/25/22 1830  vancomycin (VANCOCIN) IVPB 1000 mg/200 mL premix        1,000 mg 200 mL/hr over 60 Minutes Intravenous  Once 03/25/22 1824 03/25/22 2115    03/25/22 1830  levofloxacin (LEVAQUIN) IVPB 750 mg        750 mg 100 mL/hr over 90 Minutes Intravenous  Once 03/25/22 1824 03/25/22 2011        Subjective: Seen and examined at bedside and thinks her dizziness is improved a little bit.  Not as short of breath but still remains a little short of breath.  Still feels tired and weak.  No lightheadedness now.  No other concerns or complaints at this time.  Objective: Vitals:   03/28/22 0500 03/28/22 0806 03/28/22 1233 03/28/22 1549  BP:  (!) 155/61  (!) 145/58  Pulse:  72  73  Resp:  19  18  Temp:  98.5 F (36.9 C)  99.1 F (37.3 C)  TempSrc:  Oral    SpO2:  95% 98% 97%  Weight: 61.3 kg     Height:        Intake/Output Summary (Last 24 hours) at 03/28/2022 1858 Last data filed at 03/28/2022 1607 Gross per 24 hour  Intake 1458.34 ml  Output 165 ml  Net 1293.34 ml   Filed Weights   03/26/22 0435 03/27/22 0242 03/28/22 0500  Weight: 63.2 kg 64.5 kg 61.3 kg   Examination: Physical Exam:  Constitutional: Thin chronically ill-appearing elderly Caucasian female  seen at the edge of bed working with therapy and in no acute distress currently. Respiratory: Diminished to auscultation bilaterally with coarse breath sounds and some rhonchi as well as crackles noted.  No appreciable wheezing. Normal respiratory effort and patient is not tachypenic. No accessory muscle use.  Not wearing supplemental oxygen nasal cannula Cardiovascular: RRR, no murmurs / rubs / gallops. S1 and S2 auscultated.  Trace appreciable extremity edema Abdomen: Soft, non-tender, non-distended. Bowel sounds positive.  GU: Deferred. Musculoskeletal: No clubbing / cyanosis of digits/nails. No joint deformity upper and lower extremities.  Skin: No rashes, limited skin evaluation. No induration; Warm and dry.  Neurologic: CN 2-12 grossly intact with no focal deficits but she is a little hard of hearing.  Romberg sign cerebellar reflexes not assessed.  Psychiatric: Normal  judgment and insight. Alert and oriented x 3. Normal mood and appropriate affect.   Data Reviewed: I have personally reviewed following labs and imaging studies  CBC: Recent Labs  Lab 03/25/22 1248 03/28/22 0529  WBC 3.5* 3.4*  NEUTROABS  --  1.4*  HGB 9.8* 9.3*  HCT 30.4* 28.5*  MCV 100.0 96.6  PLT 112* [id number]*   Basic Metabolic Panel: Recent Labs  Lab 03/25/22 1248 03/26/22 0446 03/27/22 0404 03/28/22 0529  NA 139 138  --  138  K 4.1 3.8 3.1* 3.9  CL 106 105  --  101  CO2 26 26  --  29  GLUCOSE 111* 83  --  119*  BUN 21 18  --  23  CREATININE 0.89 0.86 1.05* 0.99  CALCIUM 9.0 8.6*  --  8.9  MG  --   --  1.4* 1.8   GFR: Estimated Creatinine Clearance: 31.3 mL/min (by C-G formula based on SCr of 0.99 mg/dL). Liver Function Tests: No results for input(s): "AST", "ALT", "ALKPHOS", "BILITOT", "PROT", "ALBUMIN" in the last 168 hours. No results for input(s): "LIPASE", "AMYLASE" in the last 168 hours. No results for input(s): "AMMONIA" in the last 168 hours. Coagulation Profile: No results for input(s): "INR", "PROTIME" in the last 168 hours. Cardiac Enzymes: No results for input(s): "CKTOTAL", "CKMB", "CKMBINDEX", "TROPONINI" in the last 168 hours. BNP (last 3 results) No results for input(s): "PROBNP" in the last 8760 hours. HbA1C: No results for input(s): "HGBA1C" in the last 72 hours. CBG: Recent Labs  Lab 03/27/22 1649  GLUCAP 106*   Lipid Profile: No results for input(s): "CHOL", "HDL", "LDLCALC", "TRIG", "CHOLHDL", "LDLDIRECT" in the last 72 hours. Thyroid Function Tests: No results for input(s): "TSH", "T4TOTAL", "FREET4", "T3FREE", "THYROIDAB" in the last 72 hours. Anemia Panel: No results for input(s): "VITAMINB12", "FOLATE", "FERRITIN", "TIBC", "IRON", "RETICCTPCT" in the last 72 hours. Sepsis Labs: Recent Labs  Lab 03/25/22 1539 03/25/22 1701  PROCALCITON  --  <0.10  LATICACIDVEN 1.4 1.5    Recent Results (from the past 240 hour(s))  Blood  culture (routine x 2)     Status: None (Preliminary result)   Collection Time: 03/25/22  3:24 PM   Specimen: BLOOD  Result Value Ref Range Status   Specimen Description BLOOD RIGHT ARM  Final   Special Requests   Final    BOTTLES DRAWN AEROBIC AND ANAEROBIC Blood Culture results may not be optimal due to an inadequate volume of blood received in culture bottles   Culture   Final    NO GROWTH 3 DAYS Performed at [facility], [address], [place], [place] [address]    Report Status PENDING  Incomplete  Blood culture (routine  x 2)     Status: None (Preliminary result)   Collection Time: 03/25/22  3:47 PM   Specimen: BLOOD  Result Value Ref Range Status   Specimen Description BLOOD RIGHT ARM  Final   Special Requests   Final    BOTTLES DRAWN AEROBIC AND ANAEROBIC Blood Culture results may not be optimal due to an inadequate volume of blood received in culture bottles   Culture   Final    NO GROWTH 3 DAYS Performed at [facility], [address] [place], [place] [address]    Report Status PENDING  Incomplete  MRSA Next Gen by PCR, Nasal     Status: None   Collection Time: 03/26/22  5:20 PM   Specimen: Nasal Mucosa; Nasal Swab  Result Value Ref Range Status   MRSA by PCR Next Gen NOT DETECTED NOT DETECTED Final    Comment: (NOTE) The GeneXpert MRSA Assay (FDA approved for NASAL specimens only), is one component of a comprehensive MRSA colonization surveillance program. It is not intended to diagnose MRSA infection nor to guide or monitor treatment for MRSA infections. Test performance is not FDA approved in patients less than 74 years old. Performed at [facility], [address] [place], [place] [address]   Body fluid culture w Gram Stain     Status: None (Preliminary result)   Collection Time: 03/27/22  3:32 PM   Specimen: PATH Cytology Pleural fluid  Result Value Ref Range Status   Specimen Description   Final    PLEURAL Performed at  [facility], [address] [place], [place] [address]    Special Requests   Final    NONE Performed at [facility], [place], [place] [address]    Gram Stain NO WBC SEEN NO ORGANISMS SEEN   Final   Culture   Final    NO GROWTH < 24 HOURS Performed at [facility], [address] [place], [place] [address]    Report Status PENDING  Incomplete    Radiology Studies: CT HEAD WO CONTRAST (5MM)  Result Date: 03/28/2022 CLINICAL DATA:  Neuro deficit, acute, stroke suspected. Generalized weakness. EXAM: CT HEAD WITHOUT CONTRAST TECHNIQUE: Contiguous axial images were obtained from the base of the skull through the vertex without intravenous contrast. RADIATION DOSE REDUCTION: This exam was performed according to the departmental dose-optimization program which includes automated exposure control, adjustment of the mA and/or kV according to patient size and/or use of iterative reconstruction technique. COMPARISON:  MRI brain 12/29/2010. FINDINGS: Brain: No acute hemorrhage. Unchanged chronic small-vessel disease. Cortical gray-white differentiation is otherwise preserved. Prominence of the ventricles and sulci within normal limits for age. No extra-axial collection. Basilar cisterns are patent. Vascular: No hyperdense vessel or unexpected calcification. Skull: No calvarial fracture or suspicious bone lesion. Skull base is unremarkable. Sinuses/Orbits: Unremarkable. Other: None. IMPRESSION: No acute intracranial abnormality. Electronically Signed   By: Emmit Alexanders M.D.   On: 03/28/2022 10:25   DG Chest Port 1 View  Result Date: 03/28/2022 CLINICAL DATA:  Status post thoracentesis EXAM: PORTABLE CHEST 1 VIEW COMPARISON:  March 27, 2022 FINDINGS: Small bilateral pleural effusions are identified, left greater than right. The left-sided effusion appears a little smaller in the interval. No definite left-sided pneumothorax. The cardiomediastinal silhouette  is stable with cardiomegaly. Spinal stimulator leads are stable. Probable mild pulmonary venous congestion without overt edema. Bibasilar atelectasis associated with the small effusions. No other changes. Significant carotid calcifications identified in the  soft tissues of the neck. IMPRESSION: 1. Small bilateral pleural effusions, left greater than right. The left-sided effusion appears a little smaller in the interval. No pneumothorax. 2. Cardiomegaly and pulmonary venous congestion without overt edema. 3. Bibasilar atelectasis. 4. Significant carotid calcifications in the soft tissues of the neck. Electronically Signed   By: Dorise Bullion III M.D.   On: 03/28/2022 08:27   US THORACENTESIS ASP PLEURAL SPACE W/IMG GUIDE  Result Date: 03/27/2022 INDICATION: History of breast cancer and uterine cancer admitted for progressive shortness of breath and DOE with increasing weakness. Imaging found bilateral pleural effusions. Request received for diagnostic and therapeutic thoracentesis. EXAM: ULTRASOUND GUIDED DIAGNOSTIC AND THERAPEUTIC LEFT THORACENTESIS MEDICATIONS: 10 mL 1 % lidocaine COMPLICATIONS: None immediate. PROCEDURE: An ultrasound guided thoracentesis was thoroughly discussed with the patient and questions answered. The benefits, risks, alternatives and complications were also discussed. The patient understands and wishes to proceed with the procedure. Written consent was obtained. Ultrasound was performed to localize and mark an adequate pocket of fluid in the left chest. The area was then prepped and draped in the normal sterile fashion. 1% Lidocaine was used for local anesthesia. Under ultrasound guidance a 6 Fr Safe-T-Centesis catheter was introduced. Thoracentesis was performed. The catheter was removed and a dressing applied. FINDINGS: A total of approximately 700 cc of clear, amber fluid was removed. Samples were sent to the laboratory as requested by the clinical team. IMPRESSION: Successful  ultrasound guided left thoracentesis yielding 700 cc of pleural fluid. Read by: Narda Rutherford, AGNP-BC Electronically Signed   By: Ruthann Cancer M.D.   On: 03/27/2022 15:46   DG Chest Port 1 View  Result Date: 03/27/2022 CLINICAL DATA:  Status post left-sided thoracentesis. EXAM: PORTABLE CHEST 1 VIEW COMPARISON:  Chest x-ray 03/25/2022 FINDINGS: The heart is within normal limits in size given the AP projection and portable technique. Stable tortuosity and calcification of the thoracic aorta. Slight decrease in size of the left pleural effusion. Suspect a small postprocedural pneumothorax. Recommend a follow-up chest x-ray to re-evaluate. Persistent right pleural effusion and pulmonary infiltrates. IMPRESSION: Interval decrease in size of left pleural effusion. Suspect a small postprocedural pneumothorax. Recommend a follow-up chest x-ray to re-evaluate. Electronically Signed   By: Marijo Sanes M.D.   On: 03/27/2022 15:36    Scheduled Meds:  [START ON 03/29/2022] furosemide  40 mg Intravenous Daily   heparin  5,000 Units Subcutaneous Q8H   lidocaine (PF)  10 mL Intradermal Once   losartan  25 mg Oral Daily   metoprolol tartrate  25 mg Oral BID   polyvinyl alcohol  1 drop Both Eyes Daily   potassium chloride  40 mEq Oral BID   senna  1 tablet Oral BID   Continuous Infusions:  levofloxacin (LEVAQUIN) IV 100 mL/hr at 03/28/22 1607    LOS: 3 days   Raiford Noble, DO [facility] Available via Epic secure chat 7am-7pm After these hours, please refer to coverage provider listed on [email or web address] 03/28/2022, 6:58 PM

## 2022-03-28 NOTE — Evaluation (Signed)
Occupational Therapy Evaluation Patient Details Name: Brandi Reid MRN: [id number] DOB: 25-Jul-1927 Today's Date: 03/28/2022   History of Present Illness Brandi Reid, a 89 y/o independent woman with h/o breast cancer and uterine cancer remotely reports progressive SOB/DOE and increasing weakness over the past month or more. Her last OV PCP 01/31/22 she was stable and doing well. Other medical problems include HTN, OA knees, GERD, chronic anemia, spinal stenosis, h/o lumbar compression fracture s/p kyphoplasty, chronic back pain with failed implanted stimulator. She now presents due to severe SOB and weakness. Patient is s/p thoracentesis 03/27/22.   Clinical Impression   Patient presenting with decreased independence in self care, balance, functional mobility/transfers, and endurance. PTA pt lived alone, was Mod I for ADLs/IADLs, and Mod I for functional mobility using SPC or RW intermittently. Pt currently functioning at Mod I for bed mobility, Min guard for simulated toilet transfer, and Min guard for functional mobility at room level using RW. She required Min guard for seated LB dressing and Min A for clothing management in standing. +orthostatics during session this date. Pt will benefit from acute OT to increase overall independence in the areas of ADLs and functional mobility in order to safely discharge to next venue of care. OT recommends ongoing therapy upon discharge to maximize safety and independence with ADLs, functional mobility, decrease fall risk, and decrease caregiver burden.    Orthostatic vitals Supine: 140/55 Sitting: 133/61 Standing: 112/55     Recommendations for follow up therapy are one component of a multi-disciplinary discharge planning process, led by the attending physician.  Recommendations may be updated based on patient status, additional functional criteria and insurance authorization.   Follow Up Recommendations  Home health OT     Assistance  Recommended at Discharge Intermittent Supervision/Assistance  Patient can return home with the following A little help with walking and/or transfers;A little help with bathing/dressing/bathroom;Assistance with cooking/housework;Assist for transportation;Help with stairs or ramp for entrance    Functional Status Assessment  Patient has had a recent decline in their functional status and demonstrates the ability to make significant improvements in function in a reasonable and predictable amount of time.  Equipment Recommendations  None recommended by OT    Recommendations for Other Services       Precautions / Restrictions Precautions Precautions: Fall Precaution Comments: orthostatic Restrictions Weight Bearing Restrictions: No Other Position/Activity Restrictions: orthostatic      Mobility Bed Mobility Overal bed mobility: Modified Independent                  Transfers Overall transfer level: Needs assistance Equipment used: Rolling walker (2 wheels)   Sit to Stand: Min guard                  Balance Overall balance assessment: Modified Independent                                         ADL either performed or assessed with clinical judgement   ADL Overall ADL's : Needs assistance/impaired                     Lower Body Dressing: Min guard;Minimal assistance;Sitting/lateral leans;Sit to/from stand Lower Body Dressing Details (indicate cue type and reason): Able to thread B feet through mesh underwear with Min guard for dynamic sitting balance, figure 4 technique. Min A to hike over hips in standing. Toilet  Transfer: Rolling walker (2 wheels);Min guard Armed forces technical officer Details (indicate cue type and reason): simulated         Functional mobility during ADLs: Min guard;Rolling walker (2 wheels) (~25 ft at room level)       Vision Baseline Vision/History: 1 Wears glasses Patient Visual Report: No change from baseline        Perception     Praxis      Pertinent Vitals/Pain Pain Assessment Pain Assessment: No/denies pain     Hand Dominance     Extremity/Trunk Assessment Upper Extremity Assessment Upper Extremity Assessment: Generalized weakness   Lower Extremity Assessment Lower Extremity Assessment: Generalized weakness   Cervical / Trunk Assessment Cervical / Trunk Assessment: Normal   Communication Communication Communication: HOH (B hearing aids in place)   Cognition Arousal/Alertness: Awake/alert Behavior During Therapy: WFL for tasks assessed/performed Overall Cognitive Status: Within Functional Limits for tasks assessed                                       General Comments  Pt received on 1.5L O2 via [place], SpO2 98% sitting EOB. Pt placed on RA for trial with mobility since not on home O2, SpO2 maintained >90%.    Exercises Other Exercises Other Exercises: OT provided education re: role of OT, OT POC, post acute recs, sitting up for all meals, EOB/OOB mobility with assistance, home/fall safety.     Shoulder Instructions      Home Living Family/patient expects to be discharged to:: Private residence Living Arrangements: Alone Available Help at Discharge: Family;Available PRN/intermittently (daughter and grandsons live a few houses down) Type of Home: House Home Access: Stairs to enter Technical brewer of Steps: 1   Home Layout: One level     Bathroom Shower/Tub: Walk-in Psychologist, prison and probation services: Handicapped height     Home Equipment: Conservation officer, nature (2 wheels);Grab bars - tub/shower;Cane - single point          Prior Functioning/Environment Prior Level of Function : Independent/Modified Independent;Working/employed;Driving             Mobility Comments: patient has been using walker at work for the last couple of weeks. Also has cane she would use occasionally. 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)        OT Problem List: Decreased strength;Decreased activity tolerance;Impaired balance (sitting and/or standing)      OT Treatment/Interventions: Self-care/ADL training;Therapeutic activities;Therapeutic exercise;Neuromuscular education;Energy conservation;DME and/or AE instruction;Manual therapy;Modalities;Balance training;Patient/family education;Visual/perceptual remediation/compensation;Cognitive remediation/compensation;Splinting    OT Goals(Current goals can be found in the care plan section) Acute Rehab OT Goals Patient Stated Goal: return home OT Goal Formulation: With patient Time For Goal Achievement: 04/11/22 Potential to Achieve Goals: Good   OT Frequency: Min 2X/week    Co-evaluation              AM-PAC OT "6 Clicks" Daily Activity     Outcome Measure Help from another person eating meals?: None Help from another person taking care of personal grooming?: A Little Help from another person toileting, which includes using toliet, bedpan, or urinal?: A Little Help from another person bathing (including washing, rinsing, drying)?: A Little Help from another person to put on and taking off regular upper body clothing?: A Little Help from  another person to put on and taking off regular lower body clothing?: A Little 6 Click Score: 19   End of Session Equipment Utilized During Treatment: Gait belt;Rolling walker (2 wheels) Nurse Communication: Mobility status;Other (comment) (orthostatic)  Activity Tolerance: Patient tolerated treatment well;Treatment limited secondary to medical complications (Comment) (orthostatic) Patient left: in chair;with chair alarm set;with call bell/phone within reach;with family/visitor present  OT Visit Diagnosis: Other abnormalities of gait and mobility (R26.89);Muscle weakness (generalized) (M62.81);Dizziness and  giddiness (R42)                Time: 1114-1140 OT Time Calculation (min): 26 min Charges:  OT General Charges $OT Visit: 1 Visit OT Evaluation $OT Eval Low Complexity: 1 Low  [facility] MS, OTR/L ascom [phone]  03/28/22, 2:02 PM

## 2022-03-28 NOTE — Progress Notes (Signed)
[place] NOTE       Patient ID: Brandi Reid MRN: [id number] DOB/AGE: 1927/06/08 89 y.o.  Admit date: 03/25/2022 Referring Physician Dr. Priscella Mann Primary Physician Gaetano Net, NP  Primary Cardiologist Dr. Saralyn Pilar Reason for Consultation AoCHF  HPI: Brandi Reid is a 43yoF with a PMH of HTN, nonrheumatic mod MR, mod TR, and AV sclerosis by echo 03/2019, left breast cancer s/p mastectomy, uterine cancer s/p TAH, chronic back pain, hx tobacco use who presented to [facility] ED 03/25/22 with severe shortness of breath and weakness. CTA chest with loculated L pleural effusion, concern for a malignant effusion. Echo this admission resulted with severe mitral and tricuspid valve regurgitation, and moderate aortic stenosis for which cardiology is consulted.   Interval History:  - s/p L thoracentesis yesterday with 768m aspirated - overnight events noted, suspected vagal event while having a BM last night - reportedly dizzy this AM. Orthostatic with PT. Head CT negative - seen and examined early afternoon, denies dizziness or lightheadedness. Off supplemental O2, no shortness of breath or chest pain - diuresing well   Review of systems complete and found to be negative unless listed above     Past Medical History:  Diagnosis Date   Anemia    Breast cancer [place] 2005   left positive   [place]cer [place]   lt lumpectomy/chemo and rad   Complication of anesthesia    pt states she broke out in a sweat and felt like she was going to pass out during a dental procedure-no problems with surgeries though   GERD (gastroesophageal reflux disease)    History of hiatal hernia    Hypertension    Osteoarthritis, knee    Spinal stenosis    Uterine cancer ([place]     Past Surgical History:  Procedure Laterality Date   ABDOMINAL HYSTERECTOMY     APPENDECTOMY     age 89  BREAST BIOPSY Right 1980   neg   BREAST CYST EXCISION Right    BREAST LUMPECTOMY      CHOLECYSTECTOMY     KYPHOPLASTY N/A 10/30/2018   Procedure: L2 , L3 KYPHOPLASTY;  Surgeon: MHessie Knows MD;  Location: [facility] ORS;  Service: Orthopedics;  Laterality: N/A;    Medications Prior to Admission  Medication Sig Dispense Refill Last Dose   ADVIL DUAL ACTION 125-250 MG TABS Take 2 tablets by mouth every 8 (eight) hours as needed (pain.).   unk   cholecalciferol (VITAMIN D) 25 MCG (1000 UT) tablet Take 1,000 Units by mouth every evening.   03/24/2022   hydrochlorothiazide (HYDRODIURIL) 25 MG tablet Take 12.5 mg by mouth daily as needed.   unk   metoprolol tartrate (LOPRESSOR) 50 MG tablet Take 25 mg by mouth 2 (two) times daily.   03/25/2022 at 0830   Multiple Vitamins-Minerals (PRESERVISION AREDS 2 PO) Take 1 tablet by mouth 2 (two) times daily.   03/25/2022   Polyethyl Glycol-Propyl Glycol (LUBRICANT EYE DROPS) 0.4-0.3 % SOLN Place 1 drop into both eyes daily.   03/24/2022   HYDROcodone-acetaminophen (NORCO) 5-325 MG tablet Take 1 tablet by mouth every 6 (six) hours as needed. (Patient not taking: Reported on 03/25/2022) 15 tablet 0 Completed Course   Social History   Socioeconomic History   Marital status: Widowed    Spouse name: Not on file   Number of children: Not on file   Years of education: Not on file   Highest education level: Not on file  Occupational History  Not on file  Tobacco Use   Smoking status: Never   Smokeless tobacco: Never  Vaping Use   Vaping Use: Never used  Substance and Sexual Activity   Alcohol use: No   Drug use: No   Sexual activity: Not on file  Other Topics Concern   Not on file  Social History Narrative   Not on file   Social Determinants of Health   Financial Resource Strain: Not on file  Food Insecurity: No Food Insecurity (03/26/2022)   Hunger Vital Sign    Worried About Running Out of Food in the Last Year: Never true    Ran Out of Food in the Last Year: Never true  Transportation Needs: No Transportation Needs (03/26/2022)    PRAPARE - Hydrologist (Medical): No    Lack of Transportation (Non-Medical): No  Physical Activity: Not on file  Stress: Not on file  Social Connections: Not on file  Intimate Partner Violence: Not At Risk (03/26/2022)   Humiliation, Afraid, Rape, and Kick questionnaire    Fear of Current or Ex-Partner: No    Emotionally Abused: No    Physically Abused: No    Sexually Abused: No    Family History  Problem Relation Age of Onset   Breast cancer Daughter 72      Intake/Output Summary (Last 24 hours) at 03/28/2022 1129 Last data filed at 03/28/2022 1000 Gross per 24 hour  Intake 304.13 ml  Output 165 ml  Net 139.13 ml     Vitals:   03/28/22 0039 03/28/22 0410 03/28/22 0500 03/28/22 0806  BP: (!) 159/72 (!) 145/54  (!) 155/61  Pulse: 75 74  72  Resp:  17  19  Temp: 98.4 F (36.9 C) 98.7 F (37.1 C)  98.5 F (36.9 C)  TempSrc: Oral Oral  Oral  SpO2: 98% 90%  95%  Weight:   61.3 kg   Height:        PHYSICAL EXAM General: Pleasant thin elderly Caucasian female, in no acute distress.  Sitting upright in recliner HEENT:  Normocephalic and atraumatic.  Hard of hearing. Hearing aids present Neck:  + JVD.  Lungs: Normal respiratory effort on room air, bibasilar crackles. Aeration improving compared to yesterday Heart: HRRR . Normal S1 and S2 without gallops.  2/6 systolic murmur best heard at the apex. Abdomen: Non-distended appearing.  Msk: Normal strength and tone for age. Extremities: Warm and well perfused. No clubbing, cyanosis.  1+ bilateral peripheral edema, improving Neuro: Alert and oriented X 3. Psych:  Answers questions appropriately.   Labs: Basic Metabolic Panel: Recent Labs    03/26/22 0446 03/27/22 0404 03/28/22 0529  NA 138  --  138  K 3.8 3.1* 3.9  CL 105  --  101  CO2 26  --  29  GLUCOSE 83  --  119*  BUN 18  --  23  CREATININE 0.86 1.05* 0.99  CALCIUM 8.6*  --  8.9  MG  --  1.4* 1.8    Liver Function Tests: No  results for input(s): "AST", "ALT", "ALKPHOS", "BILITOT", "PROT", "ALBUMIN" in the last 72 hours. No results for input(s): "LIPASE", "AMYLASE" in the last 72 hours. CBC: Recent Labs    03/25/22 1248 03/28/22 0529  WBC 3.5* 3.4*  NEUTROABS  --  1.4*  HGB 9.8* 9.3*  HCT 30.4* 28.5*  MCV 100.0 96.6  PLT 112* 139*    Cardiac Enzymes: Recent Labs    03/25/22 1248 03/25/22 1701  TROPONINIHS 32* 32*    BNP: Recent Labs    03/25/22 1248 03/26/22 0446  BNP 2,271.6* 3,106.5*    D-Dimer: No results for input(s): "DDIMER" in the last 72 hours. Hemoglobin A1C: No results for input(s): "HGBA1C" in the last 72 hours. Fasting Lipid Panel: No results for input(s): "CHOL", "HDL", "LDLCALC", "TRIG", "CHOLHDL", "LDLDIRECT" in the last 72 hours. Thyroid Function Tests: No results for input(s): "TSH", "T4TOTAL", "T3FREE", "THYROIDAB" in the last 72 hours.  Invalid input(s): "FREET3" Anemia Panel: No results for input(s): "VITAMINB12", "FOLATE", "FERRITIN", "TIBC", "IRON", "RETICCTPCT" in the last 72 hours.   Radiology: CT HEAD WO CONTRAST (5MM)  Result Date: 03/28/2022 CLINICAL DATA:  Neuro deficit, acute, stroke suspected. Generalized weakness. EXAM: CT HEAD WITHOUT CONTRAST TECHNIQUE: Contiguous axial images were obtained from the base of the skull through the vertex without intravenous contrast. RADIATION DOSE REDUCTION: This exam was performed according to the departmental dose-optimization program which includes automated exposure control, adjustment of the mA and/or kV according to patient size and/or use of iterative reconstruction technique. COMPARISON:  MRI brain 12/29/2010. FINDINGS: Brain: No acute hemorrhage. Unchanged chronic small-vessel disease. Cortical gray-white differentiation is otherwise preserved. Prominence of the ventricles and sulci within normal limits for age. No extra-axial collection. Basilar cisterns are patent. Vascular: No hyperdense vessel or unexpected  calcification. Skull: No calvarial fracture or suspicious bone lesion. Skull base is unremarkable. Sinuses/Orbits: Unremarkable. Other: None. IMPRESSION: No acute intracranial abnormality. Electronically Signed   By: Emmit Alexanders M.D.   On: 03/28/2022 10:25   DG Chest Port 1 View  Result Date: 03/28/2022 CLINICAL DATA:  Status post thoracentesis EXAM: PORTABLE CHEST 1 VIEW COMPARISON:  March 27, 2022 FINDINGS: Small bilateral pleural effusions are identified, left greater than right. The left-sided effusion appears a little smaller in the interval. No definite left-sided pneumothorax. The cardiomediastinal silhouette is stable with cardiomegaly. Spinal stimulator leads are stable. Probable mild pulmonary venous congestion without overt edema. Bibasilar atelectasis associated with the small effusions. No other changes. Significant carotid calcifications identified in the soft tissues of the neck. IMPRESSION: 1. Small bilateral pleural effusions, left greater than right. The left-sided effusion appears a little smaller in the interval. No pneumothorax. 2. Cardiomegaly and pulmonary venous congestion without overt edema. 3. Bibasilar atelectasis. 4. Significant carotid calcifications in the soft tissues of the neck. Electronically Signed   By: Dorise Bullion III M.D.   On: 03/28/2022 08:27   US THORACENTESIS ASP PLEURAL SPACE W/IMG GUIDE  Result Date: 03/27/2022 INDICATION: History of breast cancer and uterine cancer admitted for progressive shortness of breath and DOE with increasing weakness. Imaging found bilateral pleural effusions. Request received for diagnostic and therapeutic thoracentesis. EXAM: ULTRASOUND GUIDED DIAGNOSTIC AND THERAPEUTIC LEFT THORACENTESIS MEDICATIONS: 10 mL 1 % lidocaine COMPLICATIONS: None immediate. PROCEDURE: An ultrasound guided thoracentesis was thoroughly discussed with the patient and questions answered. The benefits, risks, alternatives and complications were also  discussed. The patient understands and wishes to proceed with the procedure. Written consent was obtained. Ultrasound was performed to localize and mark an adequate pocket of fluid in the left chest. The area was then prepped and draped in the normal sterile fashion. 1% Lidocaine was used for local anesthesia. Under ultrasound guidance a 6 Fr Safe-T-Centesis catheter was introduced. Thoracentesis was performed. The catheter was removed and a dressing applied. FINDINGS: A total of approximately 700 cc of clear, amber fluid was removed. Samples were sent to the laboratory as requested by the clinical team. IMPRESSION: Successful ultrasound guided left thoracentesis  yielding 700 cc of pleural fluid. Read by: Narda Rutherford, AGNP-BC Electronically Signed   By: Ruthann Cancer M.D.   On: 03/27/2022 15:46   DG Chest Port 1 View  Result Date: 03/27/2022 CLINICAL DATA:  Status post left-sided thoracentesis. EXAM: PORTABLE CHEST 1 VIEW COMPARISON:  Chest x-ray 03/25/2022 FINDINGS: The heart is within normal limits in size given the AP projection and portable technique. Stable tortuosity and calcification of the thoracic aorta. Slight decrease in size of the left pleural effusion. Suspect a small postprocedural pneumothorax. Recommend a follow-up chest x-ray to re-evaluate. Persistent right pleural effusion and pulmonary infiltrates. IMPRESSION: Interval decrease in size of left pleural effusion. Suspect a small postprocedural pneumothorax. Recommend a follow-up chest x-ray to re-evaluate. Electronically Signed   By: Marijo Sanes M.D.   On: 03/27/2022 15:36   [place] CHEST (PLEURAL EFFUSION)  Result Date: 03/26/2022 CLINICAL DATA:  Pleural effusion EXAM: CHEST ULTRASOUND COMPARISON:  None Available. FINDINGS: Focused sonographic exam of the right and left chest was performed for fluid assessment. Images of the right chest demonstrate a small and loculated pleural effusion. Images of the left foot demonstrate a moderate sized  effusion with free fluid. IMPRESSION: 1. Moderate left pleural effusion with free fluid, amenable to image guided thoracentesis if desired. 2. Small and loculated right pleural effusion not amenable to image guided thoracentesis. 3. Patient declined thoracentesis at the time of this examination. Electronically Signed   By: Albin Felling M.D.   On: 03/26/2022 16:18   ECHOCARDIOGRAM COMPLETE  Result Date: 03/26/2022    ECHOCARDIOGRAM REPORT   Patient Name:   Brandi Reid Date of Exam: 03/26/2022 Medical Rec #:  [id number]                  Height:       64.0 in Accession #:    [id number]                 Weight:       139.3 lb Date of Birth:  [birth year]                 BSA:          1.678 m Patient Age:    7 years                   BP:           182/80 mmHg Patient Gender: F                          HR:           79 bpm. Exam Location:  [facility] Procedure: 2D Echo, Cardiac Doppler and Color Doppler Indications:     CHF-acute diastolic [id number]  History:         Patient has no prior history of Echocardiogram examinations.                  Risk Factors:Hypertension.  Sonographer:     Sherrie Sport Referring Phys:  [id number] MICHAEL E NORINS Diagnosing Phys: Nelva Bush MD  Sonographer Comments: Image acquisition challenging due to patient body habitus. IMPRESSIONS  1. Left ventricular ejection fraction, by estimation, is 50 to 55%. The left ventricle has low normal function. The left ventricle has no regional wall motion abnormalities. There is moderate left ventricular hypertrophy. Left ventricular diastolic function could not be evaluated.  2. Right ventricular systolic function is normal. The right ventricular size is  normal. Mildly increased right ventricular wall thickness. There is severely elevated pulmonary artery systolic pressure. The estimated right ventricular systolic pressure is [id number] mmHg.  3. Left atrial size was mildly dilated.  4. Moderate pleural effusion in the left lateral region.  5. The  mitral valve is degenerative. Severe mitral valve regurgitation. No evidence of mitral stenosis. Severe mitral annular calcification.  6. Tricuspid valve regurgitation is severe.  7. The aortic valve has an indeterminant number of cusps. There is moderate calcification of the aortic valve. There is severe thickening of the aortic valve. Aortic valve regurgitation is not visualized. Moderate aortic valve stenosis. Aortic valve area, by VTI measures 0.88 cm. Aortic valve mean gradient measures 17.0 mmHg.  8. The inferior vena cava is dilated in size with <50% respiratory variability, suggesting right atrial pressure of 15 mmHg. FINDINGS  Left Ventricle: Left ventricular ejection fraction, by estimation, is 50 to 55%. The left ventricle has low normal function. The left ventricle has no regional wall motion abnormalities. The left ventricular internal cavity size was normal in size. There is moderate left ventricular hypertrophy. Left ventricular diastolic function could not be evaluated due to mitral annular calcification (moderate or greater). Left ventricular diastolic function could not be evaluated. Right Ventricle: The right ventricular size is normal. Mildly increased right ventricular wall thickness. Right ventricular systolic function is normal. There is severely elevated pulmonary artery systolic pressure. The tricuspid regurgitant velocity is 4.47 m/s, and with an assumed right atrial pressure of 15 mmHg, the estimated right ventricular systolic pressure is [id number] mmHg. Left Atrium: Left atrial size was mildly dilated. Right Atrium: Right atrial size was normal in size. Pericardium: There is no evidence of pericardial effusion. Mitral Valve: The mitral valve is degenerative in appearance. There is mild thickening of the mitral valve leaflet(s). Severe mitral annular calcification. Severe mitral valve regurgitation. No evidence of mitral valve stenosis. MV peak gradient, 6.5 mmHg. The mean mitral valve  gradient is 3.0 mmHg. Tricuspid Valve: The tricuspid valve is not well visualized. Tricuspid valve regurgitation is severe. Aortic Valve: The aortic valve has an indeterminant number of cusps. There is moderate calcification of the aortic valve. There is severe thickening of the aortic valve. Aortic valve regurgitation is not visualized. Moderate aortic stenosis is present. Aortic valve mean gradient measures 17.0 mmHg. Aortic valve peak gradient measures 24.4 mmHg. Aortic valve area, by VTI measures 0.88 cm. Pulmonic Valve: The pulmonic valve was not well visualized. Pulmonic valve regurgitation is mild. No evidence of pulmonic stenosis. Aorta: The aortic root is normal in size and structure. Pulmonary Artery: The pulmonary artery is not well seen. Venous: The inferior vena cava is dilated in size with less than 50% respiratory variability, suggesting right atrial pressure of 15 mmHg. IAS/Shunts: The interatrial septum was not well visualized. Additional Comments: There is a moderate pleural effusion in the left lateral region.  LEFT VENTRICLE PLAX 2D LVIDd:         3.80 cm   Diastology LVIDs:         2.80 cm   LV e' medial:    5.44 cm/s LV PW:         1.50 cm   LV E/e' medial:  16.4 LV IVS:        1.50 cm   LV e' lateral:   6.53 cm/s LVOT diam:     1.90 cm   LV E/e' lateral: 13.6 LV SV:         47 LV SV Index:  28 LVOT Area:     2.84 cm  RIGHT VENTRICLE RV Basal diam:  3.50 cm RV Mid diam:    3.20 cm RV S prime:     12.80 cm/s TAPSE (M-mode): 2.6 cm LEFT ATRIUM             Index        RIGHT ATRIUM           Index LA diam:        4.10 cm 2.44 cm/m   RA Area:     13.70 cm LA Vol (A2C):   70.6 ml 42.08 ml/m  RA Volume:   33.30 ml  19.85 ml/m LA Vol (A4C):   51.7 ml 30.81 ml/m LA Biplane Vol: 63.4 ml 37.79 ml/m  AORTIC VALVE AV Area (Vmax):    0.84 cm AV Area (Vmean):   0.83 cm AV Area (VTI):     0.88 cm AV Vmax:           247.20 cm/s AV Vmean:          177.200 cm/s AV VTI:            0.533 m AV Peak  Grad:      24.4 mmHg AV Mean Grad:      17.0 mmHg LVOT Vmax:         73.40 cm/s LVOT Vmean:        51.900 cm/s LVOT VTI:          0.166 m LVOT/AV VTI ratio: 0.31  AORTA Ao Root diam: 2.30 cm MITRAL VALVE               TRICUSPID VALVE MV Area (PHT): 4.41 cm    TR Peak grad:   79.9 mmHg MV Area VTI:   1.62 cm    TR Vmax:        447.00 cm/s MV Peak grad:  6.5 mmHg MV Mean grad:  3.0 mmHg    SHUNTS MV Vmax:       1.27 m/s    Systemic VTI:  0.17 m MV Vmean:      78.6 cm/s   Systemic Diam: 1.90 cm MV Decel Time: 172 msec MV E velocity: 89.10 cm/s MV A velocity: 96.40 cm/s MV E/A ratio:  0.92 Harrell Gave End MD Electronically signed by Nelva Bush MD Signature Date/Time: 03/26/2022/4:00:38 PM    Final    CT Angio Chest Pulmonary Embolism (PE) W or WO Contrast  Addendum Date: 03/25/2022   ADDENDUM REPORT: 03/25/2022 18:43 ADDENDUM: Effusion sizes are moderate on the LEFT with a larger sub pulmonic component just over the LEFT hemidiaphragm and small to moderate on the RIGHT with loculated areas as discussed. These results were called by telephone at the time of interpretation on 03/25/2022 at 6:42 pm to provider Dr. Nickolas Madrid, Who verbally acknowledged these results. Electronically Signed   By: Zetta Bills M.D.   On: 03/25/2022 18:43   Result Date: 03/25/2022 CLINICAL DATA:  89 year old female presents for evaluation of suspected pulmonary embolism with shortness of breath and bilateral leg swelling. History of uterine and breast cancer. * Tracking Code: BO * EXAM: CT ANGIOGRAPHY CHEST WITH CONTRAST TECHNIQUE: Multidetector CT imaging of the chest was performed using the standard protocol during bolus administration of intravenous contrast. Multiplanar CT image reconstructions and MIPs were obtained to evaluate the vascular anatomy. RADIATION DOSE REDUCTION: This exam was performed according to the departmental dose-optimization program which includes automated exposure control, adjustment of the mA and/or kV  according to patient size and/or use of iterative reconstruction technique. CONTRAST:  27m OMNIPAQUE IOHEXOL 350 MG/ML SOLN COMPARISON:  None available FINDINGS: Cardiovascular: Calcified aortic atherosclerotic changes. Heart size moderately enlarged. Central pulmonary arteries are opacified to reported 83 Hounsfield units. There is no sign of venous thromboembolism in the chest. Sequela of prior cement embolism present in the RIGHT lower lobe pulmonary artery without occlusive features or signs of surrounding inflammation. Mediastinum/Nodes: Esophagus grossly normal. No discrete adenopathy in the chest. Soft tissue thickening over the RIGHT neck with signs of nodularity overlying the sternocleidomastoid muscle. Much of this could be due to presence of venous collaterals about the RIGHT neck and upper chest though this is not clear. Question of fullness of subcarinal nodal tissue and RIGHT paratracheal nodal tissue but these areas are difficult to assess due to dense contrast in the pulmonary vascular bed and in the SVC. No frank hilar nodal tissue with enlargement. Lungs/Pleura: Loculated pleural fluid throughout the chest with scalloped margins. No discrete pleural nodule is visible on this very early phase examination. Density of pleural fluid is low. Pleural fluid is loculated in the major fissure in the RIGHT chest tracking around the anterior RIGHT chest towards the level of the mid chest and seen dependently tracking posterior to the RIGHT heart and over the RIGHT hemidiaphragm. There is sub pulmonic fluid in the LEFT chest with volume loss in the LEFT lower lobe. Signs of septal thickening throughout the chest greatest at the lung bases where there is the greatest amount of pleural fluid. Small nodules at the LEFT lung base measuring 4 mm on image 92 and 93 respectively. Airways are patent. LEFT basilar consolidation and patchy areas of tree in bud. Upper Abdomen: Incidental imaging of upper abdominal  contents shows no acute process to the extent that can be evaluated on this chest CT based on timing of the bolus and artifact. Musculoskeletal: Spinal nerve stimulator in place. No chest wall mass.  Spinal degenerative changes and osteopenia. Review of the MIP images confirms the above findings. IMPRESSION: 1. No sign of venous thromboembolism in the chest. 2. 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 3. Loculated pleural fluid throughout the chest with scalloped margins. No discrete pleural nodule is visible on this very early phase examination. Given patient history of neoplasm this raises concern for malignant effusions. Effusions related to heart failure or volume overload would not be expected to have this loculated appearance. Post infectious pleural fluid would likely have greater enhancement. When viewed in the sagittal plane in the RIGHT chest there is frank nodularity though measuring lower density than expected for soft tissue along the major and minor fissures. 4. Patchy areas of ground-glass and septal thickening at the lung bases along with tree-in-bud nodularity could reflect mild pneumonitis. Would also correlate with signs of heart failure. In the current context follow-up will be helpful to ensure no signs of lymphangitic tumor. 5. LEFT basilar consolidation and patchy areas of tree in bud. Findings are concerning for pneumonia. 6. Soft tissue thickening over the RIGHT neck with signs of nodularity overlying the sternocleidomastoid muscle. Much of this could be due to presence of venous collaterals about the RIGHT neck and upper chest though this is not clear. Follow-up ultrasound may be helpful for further evaluation of this area. 7. Small nodules at the LEFT lung base measuring 4 mm respectively. Nonspecific findings, suggest attention on follow-up. 8. Aortic atherosclerosis.  Aortic Atherosclerosis  (ICD10-I70.0). Electronically Signed: By: Zetta Bills M.D. 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 Infarct would be felt unlikely given chronicity of cement embolization unless there was recent cement augmentation. On the lateral projection convex margins seen in the area of the RIGHT middle lobe raise the question of underlying mass. CT of the chest is suggested for further evaluation. Dense consolidation at the LEFT lower lobe and other airspace disease in the RIGHT chest concerning for pneumonia. These results were called by telephone at the time of interpretation on 03/25/2022 at 1:36 pm to provider Dr Joni Fears , who verbally acknowledged these results. Electronically Signed   By: Zetta Bills M.D.   On: 03/25/2022 13:36   Result Date: 03/25/2022 CLINICAL DATA:  Chest pain shortness of breath. EXAM: CHEST - 2 VIEW COMPARISON:  March 18, 2016.  No recent chest imaging. FINDINGS: Trachea is midline. Cardiomediastinal contours and hilar structures with mild cardiac enlargement. Dense tubular material at the RIGHT hilum new since previous imaging, interval kyphoplasty. Dense consolidation at the LEFT lung base is noted with small LEFT-sided pleural effusion. Small to moderate RIGHT-sided effusion with ovoid opacity in the RIGHT mid chest potentially loculated fluid or airspace process in the RIGHT mid chest peripheral areas of added density about the RIGHT hilum. Signs of spinal nerve stimulator in place, tips over the midthoracic spine. Faintly visualized changes recent kyphoplasty in upper lumbar spine. Osteopenia without acute skeletal process. IMPRESSION: 1. Findings suspicious for bilateral pneumonia or aspiration with small effusions. Would also correlate with signs heart failure. 2. Suspected cement embolization to the RIGHT lung. Ovoid opacity in the RIGHT chest could reflect  pneumonia or infarct or loculated pleural fluid within the major fissure at the peripheral lung. CT may be helpful for further evaluation. 3. Suggest follow-up to ensure resolution of above findings. Electronically Signed: By: Zetta Bills M.D. On: 03/25/2022 13:20    ECHO   1. Left ventricular ejection fraction, by estimation, is 50 to 55%. The  left ventricle has low normal function. The left ventricle has no regional  wall motion abnormalities. There is moderate left ventricular hypertrophy.  Left ventricular diastolic  function could not be evaluated.   2. Right ventricular systolic function is normal. The right ventricular  size is normal. Mildly increased right ventricular wall thickness. There  is severely elevated pulmonary artery systolic pressure. The estimated  right ventricular systolic pressure  is [id number] mmHg.   3. Left atrial size was mildly dilated.   4. Moderate pleural effusion in the left lateral region.   5. The mitral valve is degenerative. Severe mitral valve regurgitation.  No evidence of mitral stenosis. Severe mitral annular calcification.   6. Tricuspid valve regurgitation is severe.   7. The aortic valve has an indeterminant number of cusps. There is  moderate calcification of the aortic valve. There is severe thickening of  the aortic valve. Aortic valve regurgitation is not visualized. Moderate  aortic valve stenosis. Aortic valve  area, by VTI measures 0.88 cm. Aortic valve mean gradient measures 17.0  mmHg.   8. The inferior vena cava is dilated in size with <50% respiratory  variability, suggesting right atrial pressure of 15 mmHg.   03/17/2019 NORMAL LEFT VENTRICULAR SYSTOLIC FUNCTION   WITH MILD LVH  NORMAL RIGHT VENTRICULAR SYSTOLIC FUNCTION  MODERATE VALVULAR REGURGITATION (See above)  NO VALVULAR STENOSIS  MODERATE  TR  SCLEROTIC AoV  MILD MR  EF 55%   TELEMETRY reviewed by me (LT) 03/28/2022 : None currently available  EKG reviewed by me:  NSR rate 74  Data reviewed by me (LT) 03/28/2022: , hospitalist progress note last 24h vitals tele labs imaging I/O   Principal Problem:   Loculated pleural effusion Active Problems:   SOB (shortness of breath)   HTN (hypertension)   GERD (gastroesophageal reflux disease)   CHF (congestive heart failure) ([place])   Do not intubate, cardiopulmonary resuscitation (CPR)-only code status    ASSESSMENT AND PLAN:  Brandi Reid is a 50yoF with a PMH of HTN, nonrheumatic mod MR, mod TR, and AV sclerosis by echo 03/2019, left breast cancer s/p mastectomy, uterine cancer s/p TAH, chronic back pain, hx tobacco use who presented to [facility] ED 03/25/22 with severe shortness of breath and weakness. CTA chest with loculated L pleural effusion, concern for a malignant effusion. Echo this admission resulted with severe mitral and tricuspid valve regurgitation, and moderate aortic stenosis for which cardiology is consulted.   # Loculated pleural effusions Seen on initial CTA chest with concern for malignant effusions, s/p thoracentesis 2/27. Cytology pending   # Acute on chronic HFpEF (EF 50-55%, moderate LVH) # Severe MR, TR # Moderate aortic stenosis Valve regurg worsening since 2021 by echo, previously managed with as needed Lasix 20 mg once daily which the patient admits to not taking recently.  Presents with 3-4 weeks of profound weakness and fatigue, dyspnea on exertion and peripheral edema.  Clinically hypervolemic with 1+ pitting edema bilaterally and an oxygen requirement, BNP of 3100. Weaned to room air 2/28, volume status improving  -decrease IV lasix '40mg'$  to once daily. Will likely need daily loop diuretic at discharge -Agree with metoprolol tartrate 25 mg twice daily, losartan 25 mg once daily -stop amlodipine with orthostasis, may need to decrease losartan/metop too  -Monitor and replete electrolytes for a K >4, mag >2 -Will continue to optimize volume status, unlikely a candidate for  surgical valve repair due to advanced age. Will also await pleural fluid cytology  # Demand ischemia Borderline elevated HS-Tn flat trending at 32, 32 in the absence of chest pain or EKG changes this is most consistent with demand/supply mismatch and not ACS.  This patient's plan of care was discussed and created with Dr. Saralyn Pilar and he is in agreement.  Signed: Tristan Schroeder , PA-C 03/28/2022, 11:29 AM [facility]

## 2022-03-28 NOTE — Progress Notes (Signed)
Gave patient morning medications. Patient alert and orientated. Patient states this morning that she is very weak and does not feel right. Made MD aware. New orders for TEDs and orthostatics. Patient was unable to sit at side of bed for orthostatics because she said she was so light headed and couldn't hold her head up. Again made MD aware. New order for head CT.

## 2022-03-28 NOTE — Care Management Important Message (Signed)
Important Message  Patient Details  Name: Brandi Reid MRN: [id number] Date of Birth: May 20, 1927   Medicare Important Message Given:  Yes     Brandi Reid 03/28/2022, 2:44 PM

## 2022-03-29 LAB — BASIC METABOLIC PANEL
Anion gap: 8 (ref 5–15)
BUN: 26 mg/dL — ABNORMAL HIGH (ref 8–23)
CO2: 28 mmol/L (ref 22–32)
Calcium: 8.8 mg/dL — ABNORMAL LOW (ref 8.9–10.3)
Chloride: 105 mmol/L (ref 98–111)
Creatinine, Ser: 1.03 mg/dL — ABNORMAL HIGH (ref 0.44–1.00)
GFR, Estimated: 51 mL/min — ABNORMAL LOW (ref >60)
Glucose, Bld: 92 mg/dL (ref 70–99)
Potassium: 4.9 mmol/L (ref 3.5–5.1)
Sodium: 141 mmol/L (ref 135–145)

## 2022-03-29 LAB — CYTOLOGY - NON PAP

## 2022-03-29 LAB — GLUCOSE, CAPILLARY
Glucose-Capillary: 84 mg/dL (ref 70–99)
Glucose-Capillary: 103 mg/dL — ABNORMAL HIGH (ref 70–99)
Glucose-Capillary: 107 mg/dL — ABNORMAL HIGH (ref 70–99)

## 2022-03-29 LAB — MAGNESIUM: Magnesium: 2 mg/dL (ref 1.7–2.4)

## 2022-03-29 MED ORDER — FUROSEMIDE 20 MG PO TABS
20.0000 mg | ORAL_TABLET | Freq: Every day | ORAL | Status: DC
  Administered 2022-03-30 – 2022-03-31 (×2): 20 mg via ORAL
  Filled 2022-03-29 (×2): qty 1

## 2022-03-29 NOTE — Progress Notes (Signed)
PROGRESS NOTE    Brandi Reid  [id number] DOB: May 06, 1927 DOA: 03/25/2022 PCP: Sallee Lange, NP    Brief Narrative:  89 y/o independent woman with h/o breast cancer and uterine cancer remotely reports progressive SOB/DOE and increasing weakness over the past month or more. Her last OV PCP 01/31/22 she was stable and doing well. Other medical problems include HTN, OA knees, GERD, chronic anemia, spinal stenosis, h/o lumbar compression fracture s/p kyphoplasty, chronic back pain with failed implanted stimulator. She now presents due to severe SOB and weakness. She denies fever, chills, productive cough, chest pain, any GI symptoms.    2/27: Attempted to complete thoracentesis yesterday however patient apparently became overwhelmed and declined to have the procedure done.  I spoke to her daughter over the phone who did agree that thoracentesis was necessary and she stated she would talk to her mother.  I also discussed the case with pulmonary who reviewed the films.  Indicates that thoracentesis is necessary however patient does not need chest tube at this time. 2/28: Patient was more dizzy this morning.  Likely from aggressive diuresis.  Cardiology is going back on diuretics 2/29: Patient doing well overall.  Remains on room air.  Cytology from pleural fluid is pending.  Antibiotics will be discontinued after today's dose.  Assessment & Plan:   Principal Problem:   Loculated pleural effusion Active Problems:   SOB (shortness of breath)   CHF (congestive heart failure) (HCC)   HTN (hypertension)   GERD (gastroesophageal reflux disease)   Do not intubate, cardiopulmonary resuscitation (CPR)-only code status  * Loculated pleural effusion Loculated pleural effusion of unclear etiology.  Unable to totally exclude infection but seems less likely at this time.  There is some nodularity which may suggest malignancy given remote history of cancer.  Also has some pulmonary  edema complicating the situation.  Patient with a very complex oncologic history including breast and uterine cancer as well as a history of smoking in the past. Patient initially declined to have thoracentesis done on 2/26.  IR reconsulted This performed.  700 cc fluid removed.  Not grossly infected. Plan: Await cytology No indication for chest tube  CHF (congestive heart failure) ([place]), suspect diastolic Patient w/o prior h/o CHF now with elevated BNP, abnormal CXR and CTA suggestive of pulmonary edema, infection unlikely w/o fever, productive cough or leukocytosis. She endorses progressive SOB/DOE over at least the past month along with peripheral edema. She denies rigors, fever at home, any chest pain.  Plan: 2D echocardiogram completed, normal ejection fraction Lasix decreased to 20 mg p.o. daily Continue beta-blocker, added ARB, add amlodipine Strict ins and outs, daily weights [facility] consulted  GERD (gastroesophageal reflux disease) No active symptoms and she takes no medication Daily PPI for stress ulcer prophylaxis   HTN (hypertension) Patient has developed HTN in her later years.  Hypertension now Beta-blocker and ARB amlodipine   Do not intubate, cardiopulmonary resuscitation (CPR)-only code status Discussed with patient with daughter present. She was very clear about not wanting cardio-pulmonary resuscitation in the event of cardiac arrest or respiratory arrest.  She is willing to accept possible radiation showed the pleural fluid revealed malignancy however she does not wish to go through chemotherapy.  DNR/DNI       DVT prophylaxis: SQ heparin Code Status: DNR Family Communication: Daughter via phone 2/26, 2/27 Disposition Plan: Status is: Inpatient Remains inpatient appropriate because: Loculated pleural effusion, hypoxia, shortness of breath.  Status post thoracentesis.  Respiratory status improved.  Awaiting cytology.   Level of care: Telemetry  Medical  Consultants:  IR Pulmonary Cardiology Palliative care  Procedures:  Thoracentesis 2/26  Antimicrobials: Vancomycin Levaquin   Subjective: Patient seen and examined.  History limited by hearing loss.  Resting in bed.  No interval status changes.  No apparent distress  Objective: Vitals:   03/29/22 0409 03/29/22 0500 03/29/22 0901 03/29/22 1158  BP: (!) 149/69  (!) 162/65 (!) 149/66  Pulse: 69  70 62  Resp: 16  18   Temp:   98 F (36.7 C)   TempSrc:      SpO2: 96%  99% 100%  Weight:  57.1 kg    Height:        Intake/Output Summary (Last 24 hours) at 03/29/2022 1345 Last data filed at 03/29/2022 1028 Gross per 24 hour  Intake 1321.67 ml  Output --  Net 1321.67 ml   Filed Weights   03/27/22 0242 03/28/22 0500 03/29/22 0500  Weight: 64.5 kg 61.3 kg 57.1 kg    Examination:  General exam: No acute distress Respiratory system: Mild bibasilar crackles.  Normal work of breathing.  Room air Cardiovascular system: [id number], RRR, 2/6 systolic murmur, no pedal edema Gastrointestinal system: Thin, soft, NT/ND, normal bowel sounds Central nervous system: Alert, oriented x 2 no focal deficits Extremities: Symmetric 5 x 5 power. Skin: No rashes, lesions or ulcers Psychiatry: Judgement and insight appear normal. Mood & affect appropriate.     Data Reviewed: I have personally reviewed following labs and imaging studies  CBC: Recent Labs  Lab 03/25/22 1248 03/28/22 0529  WBC 3.5* 3.4*  NEUTROABS  --  1.4*  HGB 9.8* 9.3*  HCT 30.4* 28.5*  MCV 100.0 96.6  PLT 112* [id number]*   Basic Metabolic Panel: Recent Labs  Lab 03/25/22 1248 03/26/22 0446 03/27/22 0404 03/28/22 0529 03/29/22 0420  NA 139 138  --  138 141  K 4.1 3.8 3.1* 3.9 4.9  CL 106 105  --  101 105  CO2 26 26  --  29 28  GLUCOSE 111* 83  --  119* 92  BUN 21 18  --  23 26*  CREATININE 0.89 0.86 1.05* 0.99 1.03*  CALCIUM 9.0 8.6*  --  8.9 8.8*  MG  --   --  1.4* 1.8 2.0   GFR: Estimated  Creatinine Clearance: 30.1 mL/min (A) (by C-G formula based on SCr of 1.03 mg/dL (H)). Liver Function Tests: No results for input(s): "AST", "ALT", "ALKPHOS", "BILITOT", "PROT", "ALBUMIN" in the last 168 hours. No results for input(s): "LIPASE", "AMYLASE" in the last 168 hours. No results for input(s): "AMMONIA" in the last 168 hours. Coagulation Profile: No results for input(s): "INR", "PROTIME" in the last 168 hours. Cardiac Enzymes: No results for input(s): "CKTOTAL", "CKMB", "CKMBINDEX", "TROPONINI" in the last 168 hours. BNP (last 3 results) No results for input(s): "PROBNP" in the last 8760 hours. HbA1C: No results for input(s): "HGBA1C" in the last 72 hours. CBG: Recent Labs  Lab 03/27/22 1649 03/29/22 1158  GLUCAP 106* 84   Lipid Profile: No results for input(s): "CHOL", "HDL", "LDLCALC", "TRIG", "CHOLHDL", "LDLDIRECT" in the last 72 hours. Thyroid Function Tests: No results for input(s): "TSH", "T4TOTAL", "FREET4", "T3FREE", "THYROIDAB" in the last 72 hours. Anemia Panel: No results for input(s): "VITAMINB12", "FOLATE", "FERRITIN", "TIBC", "IRON", "RETICCTPCT" in the last 72 hours. Sepsis Labs: Recent Labs  Lab 03/25/22 1539 03/25/22 1701  PROCALCITON  --  <0.10  LATICACIDVEN 1.4 1.5    Recent Results (from the  past 240 hour(s))  Blood culture (routine x 2)     Status: None (Preliminary result)   Collection Time: 03/25/22  3:24 PM   Specimen: BLOOD  Result Value Ref Range Status   Specimen Description BLOOD RIGHT ARM  Final   Special Requests   Final    BOTTLES DRAWN AEROBIC AND ANAEROBIC Blood Culture results may not be optimal due to an inadequate volume of blood received in culture bottles   Culture   Final    NO GROWTH 4 DAYS Performed at [facility], [address], [place], [place] [address]    Report Status PENDING  Incomplete  Blood culture (routine x 2)     Status: None (Preliminary result)   Collection Time: 03/25/22  3:47 PM   Specimen:  BLOOD  Result Value Ref Range Status   Specimen Description BLOOD RIGHT ARM  Final   Special Requests   Final    BOTTLES DRAWN AEROBIC AND ANAEROBIC Blood Culture results may not be optimal due to an inadequate volume of blood received in culture bottles   Culture   Final    NO GROWTH 4 DAYS Performed at [facility], [address] [place], [place] [address]    Report Status PENDING  Incomplete  MRSA Next Gen by PCR, Nasal     Status: None   Collection Time: 03/26/22  5:20 PM   Specimen: Nasal Mucosa; Nasal Swab  Result Value Ref Range Status   MRSA by PCR Next Gen NOT DETECTED NOT DETECTED Final    Comment: (NOTE) The GeneXpert MRSA Assay (FDA approved for NASAL specimens only), is one component of a comprehensive MRSA colonization surveillance program. It is not intended to diagnose MRSA infection nor to guide or monitor treatment for MRSA infections. Test performance is not FDA approved in patients less than 70 years old. Performed at [facility], [address] [place], [place] [address]   Body fluid culture w Gram Stain     Status: None (Preliminary result)   Collection Time: 03/27/22  3:32 PM   Specimen: PATH Cytology Pleural fluid  Result Value Ref Range Status   Specimen Description   Final    PLEURAL Performed at [facility], [address], [place], [place] [address]    Special Requests   Final    NONE Performed at [facility], [address] [place], [place] [address]    Gram Stain NO WBC SEEN NO ORGANISMS SEEN   Final   Culture   Final    NO GROWTH 2 DAYS Performed at [facility], [place] [address], [place], [place] [address]    Report Status PENDING  Incomplete         Radiology Studies: CT HEAD WO CONTRAST (5MM)  Result Date: 03/28/2022 CLINICAL DATA:  Neuro deficit, acute, stroke suspected. Generalized weakness. EXAM: CT HEAD WITHOUT CONTRAST TECHNIQUE: Contiguous axial images were obtained from the  base of the skull through the vertex without intravenous contrast. RADIATION DOSE REDUCTION: This exam was performed according to the departmental dose-optimization program which includes automated exposure control, adjustment of the mA and/or kV according to patient size and/or use of iterative reconstruction technique. COMPARISON:  MRI brain 12/29/2010. FINDINGS: Brain: No acute hemorrhage. Unchanged chronic small-vessel disease. Cortical gray-white differentiation is otherwise preserved. Prominence of the ventricles and sulci within normal limits for age. No extra-axial collection. Basilar cisterns are patent. Vascular: No hyperdense vessel or unexpected calcification. Skull: No calvarial fracture or  suspicious bone lesion. Skull base is unremarkable. Sinuses/Orbits: Unremarkable. Other: None. IMPRESSION: No acute intracranial abnormality. Electronically Signed   By: Emmit Alexanders M.D.   On: 03/28/2022 10:25   DG Chest Port 1 View  Result Date: 03/28/2022 CLINICAL DATA:  Status post thoracentesis EXAM: PORTABLE CHEST 1 VIEW COMPARISON:  March 27, 2022 FINDINGS: Small bilateral pleural effusions are identified, left greater than right. The left-sided effusion appears a little smaller in the interval. No definite left-sided pneumothorax. The cardiomediastinal silhouette is stable with cardiomegaly. Spinal stimulator leads are stable. Probable mild pulmonary venous congestion without overt edema. Bibasilar atelectasis associated with the small effusions. No other changes. Significant carotid calcifications identified in the soft tissues of the neck. IMPRESSION: 1. Small bilateral pleural effusions, left greater than right. The left-sided effusion appears a little smaller in the interval. No pneumothorax. 2. Cardiomegaly and pulmonary venous congestion without overt edema. 3. Bibasilar atelectasis. 4. Significant carotid calcifications in the soft tissues of the neck. Electronically Signed   By: Dorise Bullion III M.D.   On: 03/28/2022 08:27   US THORACENTESIS ASP PLEURAL SPACE W/IMG GUIDE  Result Date: 03/27/2022 INDICATION: History of breast cancer and uterine cancer admitted for progressive shortness of breath and DOE with increasing weakness. Imaging found bilateral pleural effusions. Request received for diagnostic and therapeutic thoracentesis. EXAM: ULTRASOUND GUIDED DIAGNOSTIC AND THERAPEUTIC LEFT THORACENTESIS MEDICATIONS: 10 mL 1 % lidocaine COMPLICATIONS: None immediate. PROCEDURE: An ultrasound guided thoracentesis was thoroughly discussed with the patient and questions answered. The benefits, risks, alternatives and complications were also discussed. The patient understands and wishes to proceed with the procedure. Written consent was obtained. Ultrasound was performed to localize and mark an adequate pocket of fluid in the left chest. The area was then prepped and draped in the normal sterile fashion. 1% Lidocaine was used for local anesthesia. Under ultrasound guidance a 6 Fr Safe-T-Centesis catheter was introduced. Thoracentesis was performed. The catheter was removed and a dressing applied. FINDINGS: A total of approximately 700 cc of clear, amber fluid was removed. Samples were sent to the laboratory as requested by the clinical team. IMPRESSION: Successful ultrasound guided left thoracentesis yielding 700 cc of pleural fluid. Read by: Narda Rutherford, AGNP-BC Electronically Signed   By: Ruthann Cancer M.D.   On: 03/27/2022 15:46   DG Chest Port 1 View  Result Date: 03/27/2022 CLINICAL DATA:  Status post left-sided thoracentesis. EXAM: PORTABLE CHEST 1 VIEW COMPARISON:  Chest x-ray 03/25/2022 FINDINGS: The heart is within normal limits in size given the AP projection and portable technique. Stable tortuosity and calcification of the thoracic aorta. Slight decrease in size of the left pleural effusion. Suspect a small postprocedural pneumothorax. Recommend a follow-up chest x-ray to  re-evaluate. Persistent right pleural effusion and pulmonary infiltrates. IMPRESSION: Interval decrease in size of left pleural effusion. Suspect a small postprocedural pneumothorax. Recommend a follow-up chest x-ray to re-evaluate. Electronically Signed   By: Marijo Sanes M.D.   On: 03/27/2022 15:36        Scheduled Meds:  [START ON 03/30/2022] furosemide  20 mg Oral Daily   heparin  5,000 Units Subcutaneous Q8H   lidocaine (PF)  10 mL Intradermal Once   losartan  25 mg Oral Daily   metoprolol tartrate  25 mg Oral BID   polyvinyl alcohol  1 drop Both Eyes Daily   senna  1 tablet Oral BID   Continuous Infusions:  levofloxacin (LEVAQUIN) IV 100 mL/hr at 03/28/22 1607     LOS: 4  days    Sidney Ace, MD [facility]   If 7PM-7AM, please contact night-coverage  03/29/2022, 1:45 PM

## 2022-03-29 NOTE — Progress Notes (Signed)
[place] NOTE       Patient ID: Brandi Reid MRN: [id number] DOB/AGE: Mar 13, 1927 89 y.o.  Admit date: 03/25/2022 Referring Physician Dr. Priscella Mann Primary Physician Gaetano Net, NP  Primary Cardiologist Dr. Saralyn Pilar Reason for Consultation AoCHF  HPI: Brandi Reid is a 44yoF with a PMH of HTN, nonrheumatic mod MR, mod TR, and AV sclerosis by echo 03/2019, left breast cancer s/p mastectomy, uterine cancer s/p TAH, chronic back pain, hx tobacco use who presented to [facility] ED 03/25/22 with severe shortness of breath and weakness. CTA chest with loculated L pleural effusion, concern for a malignant effusion. Echo this admission resulted with severe mitral and tricuspid valve regurgitation, and moderate aortic stenosis for which cardiology is consulted.   Interval History:  -  no acute events, feels tired -  no chest pain, shortness of breath, peripheral edema improved -  awaiting pleural fluid cytology   Review of systems complete and found to be negative unless listed above     Past Medical History:  Diagnosis Date   Anemia    Breast cancer ([place]) 2005   left positive   Cancer ([place]) 2005   lt lumpectomy/chemo and rad   Complication of anesthesia    pt states she broke out in a sweat and felt like she was going to pass out during a dental procedure-no problems with surgeries though   GERD (gastroesophageal reflux disease)    History of hiatal hernia    Hypertension    Osteoarthritis, knee    Spinal stenosis    Uterine cancer ([place])     Past Surgical History:  Procedure Laterality Date   ABDOMINAL HYSTERECTOMY     APPENDECTOMY     age 70   BREAST BIOPSY Right 1980   neg   BREAST CYST EXCISION Right    BREAST LUMPECTOMY     CHOLECYSTECTOMY     KYPHOPLASTY N/A 10/30/2018   Procedure: L2 , L3 KYPHOPLASTY;  Surgeon: Hessie Knows, MD;  Location: [facility] ORS;  Service: Orthopedics;  Laterality: N/A;    Medications Prior to Admission   Medication Sig Dispense Refill Last Dose   ADVIL DUAL ACTION 125-250 MG TABS Take 2 tablets by mouth every 8 (eight) hours as needed (pain.).   unk   cholecalciferol (VITAMIN D) 25 MCG (1000 UT) tablet Take 1,000 Units by mouth every evening.   03/24/2022   hydrochlorothiazide (HYDRODIURIL) 25 MG tablet Take 12.5 mg by mouth daily as needed.   unk   metoprolol tartrate (LOPRESSOR) 50 MG tablet Take 25 mg by mouth 2 (two) times daily.   03/25/2022 at 0830   Multiple Vitamins-Minerals (PRESERVISION AREDS 2 PO) Take 1 tablet by mouth 2 (two) times daily.   03/25/2022   Polyethyl Glycol-Propyl Glycol (LUBRICANT EYE DROPS) 0.4-0.3 % SOLN Place 1 drop into both eyes daily.   03/24/2022   HYDROcodone-acetaminophen (NORCO) 5-325 MG tablet Take 1 tablet by mouth every 6 (six) hours as needed. (Patient not taking: Reported on 03/25/2022) 15 tablet 0 Completed Course   Social History   Socioeconomic History   Marital status: Widowed    Spouse name: Not on file   Number of children: Not on file   Years of education: Not on file   Highest education level: Not on file  Occupational History   Not on file  Tobacco Use   Smoking status: Never   Smokeless tobacco: Never  Vaping Use   Vaping Use: Never used  Substance and Sexual Activity  Alcohol use: No   Drug use: No   Sexual activity: Not on file  Other Topics Concern   Not on file  Social History Narrative   Not on file   Social Determinants of Health   Financial Resource Strain: Not on file  Food Insecurity: No Food Insecurity (03/26/2022)   Hunger Vital Sign    Worried About Running Out of Food in the Last Year: Never true    Ran Out of Food in the Last Year: Never true  Transportation Needs: No Transportation Needs (03/26/2022)   PRAPARE - Hydrologist (Medical): No    Lack of Transportation (Non-Medical): No  Physical Activity: Not on file  Stress: Not on file  Social Connections: Not on file  Intimate  Partner Violence: Not At Risk (03/26/2022)   Humiliation, Afraid, Rape, and Kick questionnaire    Fear of Current or Ex-Partner: No    Emotionally Abused: No    Physically Abused: No    Sexually Abused: No    Family History  Problem Relation Age of Onset   Breast cancer Daughter 9      Intake/Output Summary (Last 24 hours) at 03/29/2022 1417 Last data filed at 03/29/2022 1028 Gross per 24 hour  Intake 1321.67 ml  Output --  Net 1321.67 ml     Vitals:   03/29/22 0409 03/29/22 0500 03/29/22 0901 03/29/22 1158  BP: (!) 149/69  (!) 162/65 (!) 149/66  Pulse: 69  70 62  Resp: 16  18   Temp:   98 F (36.7 C)   TempSrc:      SpO2: 96%  99% 100%  Weight:  57.1 kg    Height:        PHYSICAL EXAM General: Pleasant thin elderly Caucasian female, in no acute distress.  Sitting upright in bed with daughter at bedside HEENT:  Normocephalic and atraumatic.  Hard of hearing. Hearing aids present Neck:  no JVD.  Lungs: Normal respiratory effort on room air, bibasilar crackles. Aeration improving compared to yesterday Heart: HRRR . Normal S1 and S2 without gallops.  2/6 systolic murmur best heard at the apex. Abdomen: Non-distended appearing.  Msk: Normal strength and tone for age. Extremities: Warm and well perfused. No clubbing, cyanosis.  Trace bilateral LE edema, Ted hose in place Neuro: Alert and oriented X 3. Psych:  Answers questions appropriately.   Labs: Basic Metabolic Panel: Recent Labs    03/28/22 0529 03/29/22 0420  NA 138 141  K 3.9 4.9  CL 101 105  CO2 29 28  GLUCOSE 119* 92  BUN 23 26*  CREATININE 0.99 1.03*  CALCIUM 8.9 8.8*  MG 1.8 2.0    Liver Function Tests: No results for input(s): "AST", "ALT", "ALKPHOS", "BILITOT", "PROT", "ALBUMIN" in the last 72 hours. No results for input(s): "LIPASE", "AMYLASE" in the last 72 hours. CBC: Recent Labs    03/28/22 0529  WBC 3.4*  NEUTROABS 1.4*  HGB 9.3*  HCT 28.5*  MCV 96.6  PLT 139*    Cardiac  Enzymes: No results for input(s): "CKTOTAL", "CKMB", "CKMBINDEX", "TROPONINIHS" in the last 72 hours.  BNP: No results for input(s): "BNP" in the last 72 hours.  D-Dimer: No results for input(s): "DDIMER" in the last 72 hours. Hemoglobin A1C: No results for input(s): "HGBA1C" in the last 72 hours. Fasting Lipid Panel: No results for input(s): "CHOL", "HDL", "LDLCALC", "TRIG", "CHOLHDL", "LDLDIRECT" in the last 72 hours. Thyroid Function Tests: No results for input(s): "TSH", "T4TOTAL", "  T3FREE", "THYROIDAB" in the last 72 hours.  Invalid input(s): "FREET3" Anemia Panel: No results for input(s): "VITAMINB12", "FOLATE", "FERRITIN", "TIBC", "IRON", "RETICCTPCT" in the last 72 hours.   Radiology: CT HEAD WO CONTRAST (5MM)  Result Date: 03/28/2022 CLINICAL DATA:  Neuro deficit, acute, stroke suspected. Generalized weakness. EXAM: CT HEAD WITHOUT CONTRAST TECHNIQUE: Contiguous axial images were obtained from the base of the skull through the vertex without intravenous contrast. RADIATION DOSE REDUCTION: This exam was performed according to the departmental dose-optimization program which includes automated exposure control, adjustment of the mA and/or kV according to patient size and/or use of iterative reconstruction technique. COMPARISON:  MRI brain 12/29/2010. FINDINGS: Brain: No acute hemorrhage. Unchanged chronic small-vessel disease. Cortical gray-white differentiation is otherwise preserved. Prominence of the ventricles and sulci within normal limits for age. No extra-axial collection. Basilar cisterns are patent. Vascular: No hyperdense vessel or unexpected calcification. Skull: No calvarial fracture or suspicious bone lesion. Skull base is unremarkable. Sinuses/Orbits: Unremarkable. Other: None. IMPRESSION: No acute intracranial abnormality. Electronically Signed   By: Emmit Alexanders M.D.   On: 03/28/2022 10:25   DG Chest Port 1 View  Result Date: 03/28/2022 CLINICAL DATA:  Status post  thoracentesis EXAM: PORTABLE CHEST 1 VIEW COMPARISON:  March 27, 2022 FINDINGS: Small bilateral pleural effusions are identified, left greater than right. The left-sided effusion appears a little smaller in the interval. No definite left-sided pneumothorax. The cardiomediastinal silhouette is stable with cardiomegaly. Spinal stimulator leads are stable. Probable mild pulmonary venous congestion without overt edema. Bibasilar atelectasis associated with the small effusions. No other changes. Significant carotid calcifications identified in the soft tissues of the neck. IMPRESSION: 1. Small bilateral pleural effusions, left greater than right. The left-sided effusion appears a little smaller in the interval. No pneumothorax. 2. Cardiomegaly and pulmonary venous congestion without overt edema. 3. Bibasilar atelectasis. 4. Significant carotid calcifications in the soft tissues of the neck. Electronically Signed   By: Dorise Bullion III M.D.   On: 03/28/2022 08:27   US THORACENTESIS ASP PLEURAL SPACE W/IMG GUIDE  Result Date: 03/27/2022 INDICATION: History of breast cancer and uterine cancer admitted for progressive shortness of breath and DOE with increasing weakness. Imaging found bilateral pleural effusions. Request received for diagnostic and therapeutic thoracentesis. EXAM: ULTRASOUND GUIDED DIAGNOSTIC AND THERAPEUTIC LEFT THORACENTESIS MEDICATIONS: 10 mL 1 % lidocaine COMPLICATIONS: None immediate. PROCEDURE: An ultrasound guided thoracentesis was thoroughly discussed with the patient and questions answered. The benefits, risks, alternatives and complications were also discussed. The patient understands and wishes to proceed with the procedure. Written consent was obtained. Ultrasound was performed to localize and mark an adequate pocket of fluid in the left chest. The area was then prepped and draped in the normal sterile fashion. 1% Lidocaine was used for local anesthesia. Under ultrasound guidance a 6 Fr  Safe-T-Centesis catheter was introduced. Thoracentesis was performed. The catheter was removed and a dressing applied. FINDINGS: A total of approximately 700 cc of clear, amber fluid was removed. Samples were sent to the laboratory as requested by the clinical team. IMPRESSION: Successful ultrasound guided left thoracentesis yielding 700 cc of pleural fluid. Read by: Narda Rutherford, AGNP-BC Electronically Signed   By: Ruthann Cancer M.D.   On: 03/27/2022 15:46   DG Chest Port 1 View  Result Date: 03/27/2022 CLINICAL DATA:  Status post left-sided thoracentesis. EXAM: PORTABLE CHEST 1 VIEW COMPARISON:  Chest x-ray 03/25/2022 FINDINGS: The heart is within normal limits in size given the AP projection and portable technique. Stable tortuosity and calcification  of the thoracic aorta. Slight decrease in size of the left pleural effusion. Suspect a small postprocedural pneumothorax. Recommend a follow-up chest x-ray to re-evaluate. Persistent right pleural effusion and pulmonary infiltrates. IMPRESSION: Interval decrease in size of left pleural effusion. Suspect a small postprocedural pneumothorax. Recommend a follow-up chest x-ray to re-evaluate. Electronically Signed   By: Marijo Sanes M.D.   On: 03/27/2022 15:36   [place] CHEST (PLEURAL EFFUSION)  Result Date: 03/26/2022 CLINICAL DATA:  Pleural effusion EXAM: CHEST ULTRASOUND COMPARISON:  None Available. FINDINGS: Focused sonographic exam of the right and left chest was performed for fluid assessment. Images of the right chest demonstrate a small and loculated pleural effusion. Images of the left foot demonstrate a moderate sized effusion with free fluid. IMPRESSION: 1. Moderate left pleural effusion with free fluid, amenable to image guided thoracentesis if desired. 2. Small and loculated right pleural effusion not amenable to image guided thoracentesis. 3. Patient declined thoracentesis at the time of this examination. Electronically Signed   By: Albin Felling M.D.    On: 03/26/2022 16:18   ECHOCARDIOGRAM COMPLETE  Result Date: 03/26/2022    ECHOCARDIOGRAM REPORT   Patient Name:   Brandi Reid Date of Exam: 03/26/2022 Medical Rec #:  [id number]                  Height:       64.0 in Accession #:    [id number]                 Weight:       139.3 lb Date of Birth:  07-08-1927                 BSA:          1.678 m Patient Age:    35 years                   BP:           182/80 mmHg Patient Gender: F                          HR:           79 bpm. Exam Location:  [facility] Procedure: 2D Echo, Cardiac Doppler and Color Doppler Indications:     CHF-acute diastolic [id number]  History:         Patient has no prior history of Echocardiogram examinations.                  Risk Factors:Hypertension.  Sonographer:     Sherrie Sport Referring Phys:  [id number] MICHAEL E NORINS Diagnosing Phys: Nelva Bush MD  Sonographer Comments: Image acquisition challenging due to patient body habitus. IMPRESSIONS  1. Left ventricular ejection fraction, by estimation, is 50 to 55%. The left ventricle has low normal function. The left ventricle has no regional wall motion abnormalities. There is moderate left ventricular hypertrophy. Left ventricular diastolic function could not be evaluated.  2. Right ventricular systolic function is normal. The right ventricular size is normal. Mildly increased right ventricular wall thickness. There is severely elevated pulmonary artery systolic pressure. The estimated right ventricular systolic pressure is [id number] mmHg.  3. Left atrial size was mildly dilated.  4. Moderate pleural effusion in the left lateral region.  5. The mitral valve is degenerative. Severe mitral valve regurgitation. No evidence of mitral stenosis. Severe mitral annular calcification.  6. Tricuspid valve regurgitation is severe.  7. The aortic  valve has an indeterminant number of cusps. There is moderate calcification of the aortic valve. There is severe thickening of the aortic valve. Aortic valve  regurgitation is not visualized. Moderate aortic valve stenosis. Aortic valve area, by VTI measures 0.88 cm. Aortic valve mean gradient measures 17.0 mmHg.  8. The inferior vena cava is dilated in size with <50% respiratory variability, suggesting right atrial pressure of 15 mmHg. FINDINGS  Left Ventricle: Left ventricular ejection fraction, by estimation, is 50 to 55%. The left ventricle has low normal function. The left ventricle has no regional wall motion abnormalities. The left ventricular internal cavity size was normal in size. There is moderate left ventricular hypertrophy. Left ventricular diastolic function could not be evaluated due to mitral annular calcification (moderate or greater). Left ventricular diastolic function could not be evaluated. Right Ventricle: The right ventricular size is normal. Mildly increased right ventricular wall thickness. Right ventricular systolic function is normal. There is severely elevated pulmonary artery systolic pressure. The tricuspid regurgitant velocity is 4.47 m/s, and with an assumed right atrial pressure of 15 mmHg, the estimated right ventricular systolic pressure is [id number] mmHg. Left Atrium: Left atrial size was mildly dilated. Right Atrium: Right atrial size was normal in size. Pericardium: There is no evidence of pericardial effusion. Mitral Valve: The mitral valve is degenerative in appearance. There is mild thickening of the mitral valve leaflet(s). Severe mitral annular calcification. Severe mitral valve regurgitation. No evidence of mitral valve stenosis. MV peak gradient, 6.5 mmHg. The mean mitral valve gradient is 3.0 mmHg. Tricuspid Valve: The tricuspid valve is not well visualized. Tricuspid valve regurgitation is severe. Aortic Valve: The aortic valve has an indeterminant number of cusps. There is moderate calcification of the aortic valve. There is severe thickening of the aortic valve. Aortic valve regurgitation is not visualized. Moderate aortic  stenosis is present. Aortic valve mean gradient measures 17.0 mmHg. Aortic valve peak gradient measures 24.4 mmHg. Aortic valve area, by VTI measures 0.88 cm. Pulmonic Valve: The pulmonic valve was not well visualized. Pulmonic valve regurgitation is mild. No evidence of pulmonic stenosis. Aorta: The aortic root is normal in size and structure. Pulmonary Artery: The pulmonary artery is not well seen. Venous: The inferior vena cava is dilated in size with less than 50% respiratory variability, suggesting right atrial pressure of 15 mmHg. IAS/Shunts: The interatrial septum was not well visualized. Additional Comments: There is a moderate pleural effusion in the left lateral region.  LEFT VENTRICLE PLAX 2D LVIDd:         3.80 cm   Diastology LVIDs:         2.80 cm   LV e' medial:    5.44 cm/s LV PW:         1.50 cm   LV E/e' medial:  16.4 LV IVS:        1.50 cm   LV e' lateral:   6.53 cm/s LVOT diam:     1.90 cm   LV E/e' lateral: 13.6 LV SV:         47 LV SV Index:   28 LVOT Area:     2.84 cm  RIGHT VENTRICLE RV Basal diam:  3.50 cm RV Mid diam:    3.20 cm RV S prime:     12.80 cm/s TAPSE (M-mode): 2.6 cm LEFT ATRIUM             Index        RIGHT ATRIUM  Index LA diam:        4.10 cm 2.44 cm/m   RA Area:     13.70 cm LA Vol (A2C):   70.6 ml 42.08 ml/m  RA Volume:   33.30 ml  19.85 ml/m LA Vol (A4C):   51.7 ml 30.81 ml/m LA Biplane Vol: 63.4 ml 37.79 ml/m  AORTIC VALVE AV Area (Vmax):    0.84 cm AV Area (Vmean):   0.83 cm AV Area (VTI):     0.88 cm AV Vmax:           247.20 cm/s AV Vmean:          177.200 cm/s AV VTI:            0.533 m AV Peak Grad:      24.4 mmHg AV Mean Grad:      17.0 mmHg LVOT Vmax:         73.40 cm/s LVOT Vmean:        51.900 cm/s LVOT VTI:          0.166 m LVOT/AV VTI ratio: 0.31  AORTA Ao Root diam: 2.30 cm MITRAL VALVE               TRICUSPID VALVE MV Area (PHT): 4.41 cm    TR Peak grad:   79.9 mmHg MV Area VTI:   1.62 cm    TR Vmax:        447.00 cm/s MV Peak grad:  6.5  mmHg MV Mean grad:  3.0 mmHg    SHUNTS MV Vmax:       1.27 m/s    Systemic VTI:  0.17 m MV Vmean:      78.6 cm/s   Systemic Diam: 1.90 cm MV Decel Time: 172 msec MV E velocity: 89.10 cm/s MV A velocity: 96.40 cm/s MV E/A ratio:  0.92 Harrell Gave End MD Electronically signed by Nelva Bush MD Signature Date/Time: 03/26/2022/4:00:38 PM    Final    CT Angio Chest Pulmonary Embolism (PE) W or WO Contrast  Addendum Date: 03/25/2022   ADDENDUM REPORT: 03/25/2022 18:43 ADDENDUM: Effusion sizes are moderate on the LEFT with a larger sub pulmonic component just over the LEFT hemidiaphragm and small to moderate on the RIGHT with loculated areas as discussed. These results were called by telephone at the time of interpretation on 03/25/2022 at 6:42 pm to provider Dr. Nickolas Madrid, Who verbally acknowledged these results. Electronically Signed   By: Zetta Bills M.D.   On: 03/25/2022 18:43   Result Date: 03/25/2022 CLINICAL DATA:  89 year old female presents for evaluation of suspected pulmonary embolism with shortness of breath and bilateral leg swelling. History of uterine and breast cancer. * Tracking Code: BO * EXAM: CT ANGIOGRAPHY CHEST WITH CONTRAST TECHNIQUE: Multidetector CT imaging of the chest was performed using the standard protocol during bolus administration of intravenous contrast. Multiplanar CT image reconstructions and MIPs were obtained to evaluate the vascular anatomy. RADIATION DOSE REDUCTION: This exam was performed according to the departmental dose-optimization program which includes automated exposure control, adjustment of the mA and/or kV according to patient size and/or use of iterative reconstruction technique. CONTRAST:  60m OMNIPAQUE IOHEXOL 350 MG/ML SOLN COMPARISON:  None available FINDINGS: Cardiovascular: Calcified aortic atherosclerotic changes. Heart size moderately enlarged. Central pulmonary arteries are opacified to reported 83 Hounsfield units. There is no sign of venous  thromboembolism in the chest. Sequela of prior cement embolism present in the RIGHT lower lobe pulmonary artery without occlusive features or signs of surrounding  inflammation. Mediastinum/Nodes: Esophagus grossly normal. No discrete adenopathy in the chest. Soft tissue thickening over the RIGHT neck with signs of nodularity overlying the sternocleidomastoid muscle. Much of this could be due to presence of venous collaterals about the RIGHT neck and upper chest though this is not clear. Question of fullness of subcarinal nodal tissue and RIGHT paratracheal nodal tissue but these areas are difficult to assess due to dense contrast in the pulmonary vascular bed and in the SVC. No frank hilar nodal tissue with enlargement. Lungs/Pleura: Loculated pleural fluid throughout the chest with scalloped margins. No discrete pleural nodule is visible on this very early phase examination. Density of pleural fluid is low. Pleural fluid is loculated in the major fissure in the RIGHT chest tracking around the anterior RIGHT chest towards the level of the mid chest and seen dependently tracking posterior to the RIGHT heart and over the RIGHT hemidiaphragm. There is sub pulmonic fluid in the LEFT chest with volume loss in the LEFT lower lobe. Signs of septal thickening throughout the chest greatest at the lung bases where there is the greatest amount of pleural fluid. Small nodules at the LEFT lung base measuring 4 mm on image 92 and 93 respectively. Airways are patent. LEFT basilar consolidation and patchy areas of tree in bud. Upper Abdomen: Incidental imaging of upper abdominal contents shows no acute process to the extent that can be evaluated on this chest CT based on timing of the bolus and artifact. Musculoskeletal: Spinal nerve stimulator in place. No chest wall mass.  Spinal degenerative changes and osteopenia. Review of the MIP images confirms the above findings. IMPRESSION: 1. No sign of venous thromboembolism in the  chest. 2. 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 3. Loculated pleural fluid throughout the chest with scalloped margins. No discrete pleural nodule is visible on this very early phase examination. Given patient history of neoplasm this raises concern for malignant effusions. Effusions related to heart failure or volume overload would not be expected to have this loculated appearance. Post infectious pleural fluid would likely have greater enhancement. When viewed in the sagittal plane in the RIGHT chest there is frank nodularity though measuring lower density than expected for soft tissue along the major and minor fissures. 4. Patchy areas of ground-glass and septal thickening at the lung bases along with tree-in-bud nodularity could reflect mild pneumonitis. Would also correlate with signs of heart failure. In the current context follow-up will be helpful to ensure no signs of lymphangitic tumor. 5. LEFT basilar consolidation and patchy areas of tree in bud. Findings are concerning for pneumonia. 6. Soft tissue thickening over the RIGHT neck with signs of nodularity overlying the sternocleidomastoid muscle. Much of this could be due to presence of venous collaterals about the RIGHT neck and upper chest though this is not clear. Follow-up ultrasound may be helpful for further evaluation of this area. 7. Small nodules at the LEFT lung base measuring 4 mm respectively. Nonspecific findings, suggest attention on follow-up. 8. Aortic atherosclerosis. Aortic Atherosclerosis (ICD10-I70.0). Electronically Signed: By: Zetta Bills M.D. 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 Infarct would be felt unlikely given chronicity of cement embolization unless there was recent cement augmentation. On the lateral  projection convex margins seen in the area of the RIGHT middle lobe raise the question of  underlying mass. CT of the chest is suggested for further evaluation. Dense consolidation at the LEFT lower lobe and other airspace disease in the RIGHT chest concerning for pneumonia. These results were called by telephone at the time of interpretation on 03/25/2022 at 1:36 pm to provider Dr Joni Fears , who verbally acknowledged these results. Electronically Signed   By: Zetta Bills M.D.   On: 03/25/2022 13:36   Result Date: 03/25/2022 CLINICAL DATA:  Chest pain shortness of breath. EXAM: CHEST - 2 VIEW COMPARISON:  March 18, 2016.  No recent chest imaging. FINDINGS: Trachea is midline. Cardiomediastinal contours and hilar structures with mild cardiac enlargement. Dense tubular material at the RIGHT hilum new since previous imaging, interval kyphoplasty. Dense consolidation at the LEFT lung base is noted with small LEFT-sided pleural effusion. Small to moderate RIGHT-sided effusion with ovoid opacity in the RIGHT mid chest potentially loculated fluid or airspace process in the RIGHT mid chest peripheral areas of added density about the RIGHT hilum. Signs of spinal nerve stimulator in place, tips over the midthoracic spine. Faintly visualized changes recent kyphoplasty in upper lumbar spine. Osteopenia without acute skeletal process. IMPRESSION: 1. Findings suspicious for bilateral pneumonia or aspiration with small effusions. Would also correlate with signs heart failure. 2. Suspected cement embolization to the RIGHT lung. Ovoid opacity in the RIGHT chest could reflect pneumonia or infarct or loculated pleural fluid within the major fissure at the peripheral lung. CT may be helpful for further evaluation. 3. Suggest follow-up to ensure resolution of above findings. Electronically Signed: By: Zetta Bills M.D. On: 03/25/2022 13:20    ECHO   1. Left ventricular ejection fraction, by estimation, is 50 to 55%. The   left ventricle has low normal function. The left ventricle has no regional  wall motion abnormalities. There is moderate left ventricular hypertrophy.  Left ventricular diastolic  function could not be evaluated.   2. Right ventricular systolic function is normal. The right ventricular  size is normal. Mildly increased right ventricular wall thickness. There  is severely elevated pulmonary artery systolic pressure. The estimated  right ventricular systolic pressure  is [id number] mmHg.   3. Left atrial size was mildly dilated.   4. Moderate pleural effusion in the left lateral region.   5. The mitral valve is degenerative. Severe mitral valve regurgitation.  No evidence of mitral stenosis. Severe mitral annular calcification.   6. Tricuspid valve regurgitation is severe.   7. The aortic valve has an indeterminant number of cusps. There is  moderate calcification of the aortic valve. There is severe thickening of  the aortic valve. Aortic valve regurgitation is not visualized. Moderate  aortic valve stenosis. Aortic valve  area, by VTI measures 0.88 cm. Aortic valve mean gradient measures 17.0  mmHg.   8. The inferior vena cava is dilated in size with <50% respiratory  variability, suggesting right atrial pressure of 15 mmHg.   03/17/2019 NORMAL LEFT VENTRICULAR SYSTOLIC FUNCTION   WITH MILD LVH  NORMAL RIGHT VENTRICULAR SYSTOLIC FUNCTION  MODERATE VALVULAR REGURGITATION (See above)  NO VALVULAR STENOSIS  MODERATE TR  SCLEROTIC AoV  MILD MR  EF 55%   TELEMETRY reviewed by me (LT) 03/29/2022 : NSR 60-70  EKG reviewed by me: NSR rate 74  Data reviewed by me (LT) 03/29/2022: , hospitalist progress note last 24h vitals tele labs imaging I/O   Principal Problem:   Loculated pleural effusion Active Problems:   SOB (shortness of breath)   HTN (hypertension)   GERD (gastroesophageal  reflux disease)   CHF (congestive heart failure) ([place])   Do not intubate, cardiopulmonary resuscitation  (CPR)-only code status    ASSESSMENT AND PLAN:  Aloise C. Reid is a 69yoF with a PMH of HTN, nonrheumatic mod MR, mod TR, and AV sclerosis by echo 03/2019, left breast cancer s/p mastectomy, uterine cancer s/p TAH, chronic back pain, hx tobacco use who presented to [facility] ED 03/25/22 with severe shortness of breath and weakness. CTA chest with loculated L pleural effusion, concern for a malignant effusion. Echo this admission resulted with severe mitral and tricuspid valve regurgitation, and moderate aortic stenosis for which cardiology is consulted.   # Loculated pleural effusions Seen on initial CTA chest with concern for malignant effusions, s/p thoracentesis 2/27. Cytology pending   # Acute on chronic HFpEF (EF 50-55%, moderate LVH) # Severe MR, TR # Moderate aortic stenosis Valve regurg worsening since 2021 by echo, previously managed with as needed Lasix 20 mg once daily which the patient admits to not taking recently.  Presents with 3-4 weeks of profound weakness and fatigue, dyspnea on exertion and peripheral edema.  Clinically hypervolemic with 1+ pitting edema bilaterally and an oxygen requirement, BNP of 3100 on admisison. Weaned to room air 2/28, clinically euvolemic today.  -stop further IV diuresis. Start lasix '20mg'$  daily tomorrow -Agree with metoprolol tartrate 25 mg twice daily, losartan 25 mg once daily -stop amlodipine with orthostasis -Monitor and replete electrolytes for a K >4, mag >2 -Will continue to optimize volume status, unlikely a candidate for surgical valve repair due to advanced age. Will also await pleural fluid cytology  # Demand ischemia Borderline elevated HS-Tn flat trending at 32, 32 in the absence of chest pain or EKG changes this is most consistent with demand/supply mismatch and not ACS.  This patient's plan of care was discussed and created with Dr. Saralyn Pilar and he is in agreement.  Signed: Tristan Schroeder , PA-C 03/29/2022, 2:17 PM [facility]

## 2022-03-29 NOTE — Progress Notes (Addendum)
Daily Progress Note   Patient Name: Brandi Reid       Date: 03/29/2022 DOB: 1928-01-10  Age: 89 y.o. MRN#: [id number] Attending Physician: Sidney Ace, MD Primary Care Physician: Sallee Lange, NP Admit Date: 03/25/2022  Reason for Consultation/Follow-up: Establishing goals of care  Subjective: Notes and labs reviewed. In to see patient. No complaints at this time. Daughter is at bedside. Patient is awaiting results of cytology.  Daughter is aware of patient's wishes depending on outcome and recommendations.   Length of Stay: 4  Current Medications: Scheduled Meds:   furosemide  40 mg Intravenous Daily   heparin  5,000 Units Subcutaneous Q8H   lidocaine (PF)  10 mL Intradermal Once   losartan  25 mg Oral Daily   metoprolol tartrate  25 mg Oral BID   polyvinyl alcohol  1 drop Both Eyes Daily   senna  1 tablet Oral BID    Continuous Infusions:  levofloxacin (LEVAQUIN) IV 100 mL/hr at 03/28/22 1607    PRN Meds: acetaminophen **OR** acetaminophen  Physical Exam Pulmonary:     Effort: Pulmonary effort is normal.  Neurological:     Mental Status: She is alert.             Vital Signs: BP (!) 149/66 (BP Location: Right Arm)   Pulse 62   Temp 98 F (36.7 C)   Resp 18   Ht '5\' 4"'$  (1.626 m)   Wt 57.1 kg   SpO2 100%   BMI 21.61 kg/m  SpO2: SpO2: 100 % O2 Device: O2 Device: Room Air O2 Flow Rate: O2 Flow Rate (L/min): 2 L/min  Intake/output summary:  Intake/Output Summary (Last 24 hours) at 03/29/2022 1302 Last data filed at 03/29/2022 1028 Gross per 24 hour  Intake 1321.67 ml  Output --  Net 1321.67 ml   LBM: Last BM Date : 03/27/22 Baseline Weight: Weight: 56.7 kg Most recent weight: Weight: 57.1 kg    Patient Active Problem List    Diagnosis Date Noted   SOB (shortness of breath) 03/25/2022   Loculated pleural effusion 03/25/2022   HTN (hypertension) 03/25/2022   GERD (gastroesophageal reflux disease) 03/25/2022   CHF (congestive heart failure) ([place]) 03/25/2022   Do not intubate, cardiopulmonary resuscitation (CPR)-only code status 03/25/2022    Palliative Care Assessment & Plan  Recommendations/Plan: Continue current care. Awaiting results of cytology for continued conversation.   Code Status:    Code Status Orders  (From admission, onward)           Start     Ordered   03/25/22 2112  Do not attempt resuscitation (DNR)  Continuous       Question Answer Comment  If patient has no pulse and is not breathing Do Not Attempt Resuscitation   If patient has a pulse and/or is breathing: Medical Treatment Goals LIMITED ADDITIONAL INTERVENTIONS: Use medication/IV fluids and cardiac monitoring as indicated; Do not use intubation or mechanical ventilation (DNI), also provide comfort medications.  Transfer to Progressive/Stepdown as indicated, avoid Intensive Care.   Consent: Discussion documented in EHR or advanced directives reviewed      03/25/22 2111           Code Status History     Date Active Date Inactive Code Status Order ID Comments User Context   03/25/2022 2034 03/25/2022 2111 Full Code [id number]  Neena Rhymes, MD ED         Thank you for allowing the Palliative Medicine Team to assist in the care of this patient.   Asencion Gowda, NP  Please contact Palliative Medicine Team phone at [phone] for questions and concerns.

## 2022-03-29 NOTE — Progress Notes (Signed)
Physical Therapy Treatment Patient Details Name: Brandi Reid MRN: [id number] DOB: July 08, 1927 Today's Date: 03/29/2022   History of Present Illness Brandi Reid, a 89 y/o independent woman with h/o breast cancer and uterine cancer remotely reports progressive SOB/DOE and increasing weakness over the past month or more. Her last OV PCP 01/31/22 she was stable and doing well. Other medical problems include HTN, OA knees, GERD, chronic anemia, spinal stenosis, h/o lumbar compression fracture s/p kyphoplasty, chronic back pain with failed implanted stimulator. She now presents due to severe SOB and weakness. Patient is s/p thoracentesis 03/27/22.    PT Comments    Pt seen for PT tx with pt agreeable. Pt saturated with urine but unaware, PT provided assistance for changing clothing. Pt is able to complete bed mobility with HOB elevated, use of bed rails with supervision, STS with CGA<>supervision, and gait with RW & CGA<>supervision. Pt denies feeling lightheaded/dizzy throughout session but does endorse weakness. Continue to recommend PT services to address balance, strengthening, endurance, & gait with LRAD.    Recommendations for follow up therapy are one component of a multi-disciplinary discharge planning process, led by the attending physician.  Recommendations may be updated based on patient status, additional functional criteria and insurance authorization.  Follow Up Recommendations  Home health PT     Assistance Recommended at Discharge Intermittent Supervision/Assistance  Patient can return home with the following A little help with walking and/or transfers;A little help with bathing/dressing/bathroom;Assist for transportation;Assistance with cooking/housework;Help with stairs or ramp for entrance   Equipment Recommendations  None recommended by PT    Recommendations for Other Services       Precautions / Restrictions Precautions Precautions: Fall Precaution  Comments: orthostatic Restrictions Weight Bearing Restrictions: No     Mobility  Bed Mobility Overal bed mobility: Needs Assistance Bed Mobility: Supine to Sit     Supine to sit: HOB elevated, Supervision     General bed mobility comments: use of bed rails, HOB elevated to come to sitting EOB    Transfers Overall transfer level: Needs assistance Equipment used: Rolling walker (2 wheels) Transfers: Sit to/from Stand Sit to Stand: Min guard, Supervision           General transfer comment: cuing re: hand placement during STS from EOB & recliner    Ambulation/Gait Ambulation/Gait assistance: Min guard, Supervision Gait Distance (Feet):  (80 ft + 100 ft) Assistive device: Rolling walker (2 wheels) Gait Pattern/deviations: Decreased step length - right, Decreased step length - left, Decreased stride length, Decreased dorsiflexion - right, Decreased dorsiflexion - left Gait velocity: significantly decreased     General Gait Details: Multiple pauses during gait   Stairs             Wheelchair Mobility    Modified Rankin (Stroke Patients Only)       Balance Overall balance assessment: Needs assistance Sitting-balance support: Feet supported Sitting balance-Leahy Scale: Fair     Standing balance support: During functional activity, Bilateral upper extremity supported Standing balance-Leahy Scale: Fair                              Cognition Arousal/Alertness: Awake/alert Behavior During Therapy: WFL for tasks assessed/performed Overall Cognitive Status: Within Functional Limits for tasks assessed                                 General Comments: Pt  unaware of being saturated with urine in bed.        Exercises      General Comments General comments (skin integrity, edema, etc.): Pt saturated with urine, PT provides assistance for changing into clean gown, new socks. Pt with urinary incontinence upon standing so PT assisted  pt with donning brief for mobility.      Pertinent Vitals/Pain Pain Assessment Pain Assessment: No/denies pain    Home Living                          Prior Function            PT Goals (current goals can now be found in the care plan section) Acute Rehab PT Goals Patient Stated Goal: to return home PT Goal Formulation: With patient Time For Goal Achievement: 04/12/22 Potential to Achieve Goals: Good Progress towards PT goals: Progressing toward goals    Frequency    Min 2X/week      PT Plan Current plan remains appropriate    Co-evaluation              AM-PAC PT "6 Clicks" Mobility   Outcome Measure  Help needed turning from your back to your side while in a flat bed without using bedrails?: None Help needed moving from lying on your back to sitting on the side of a flat bed without using bedrails?: A Little Help needed moving to and from a bed to a chair (including a wheelchair)?: A Little Help needed standing up from a chair using your arms (e.g., wheelchair or bedside chair)?: A Little Help needed to walk in hospital room?: A Little Help needed climbing 3-5 steps with a railing? : A Little 6 Click Score: 19    End of Session   Activity Tolerance: Patient tolerated treatment well Patient left: in chair;with call bell/phone within reach;with family/visitor present Nurse Communication:  (urinary incontinence) PT Visit Diagnosis: Other abnormalities of gait and mobility (R26.89);Muscle weakness (generalized) (M62.81);Difficulty in walking, not elsewhere classified (R26.2)     Time: [id number] PT Time Calculation (min) (ACUTE ONLY): 26 min  Charges:  $Therapeutic Activity: 23-37 mins                     Brandi Reid, PT, DPT 03/29/22, 3:07 PM   Brandi Reid 03/29/2022, 3:05 PM

## 2022-03-30 LAB — BASIC METABOLIC PANEL
Anion gap: 9 (ref 5–15)
BUN: 32 mg/dL — ABNORMAL HIGH (ref 8–23)
CO2: 29 mmol/L (ref 22–32)
Calcium: 8.7 mg/dL — ABNORMAL LOW (ref 8.9–10.3)
Chloride: 101 mmol/L (ref 98–111)
Creatinine, Ser: 1.15 mg/dL — ABNORMAL HIGH (ref 0.44–1.00)
GFR, Estimated: 45 mL/min — ABNORMAL LOW (ref >60)
Glucose, Bld: 101 mg/dL — ABNORMAL HIGH (ref 70–99)
Potassium: 4.1 mmol/L (ref 3.5–5.1)
Sodium: 139 mmol/L (ref 135–145)

## 2022-03-30 LAB — CULTURE, BLOOD (ROUTINE X 2)
Culture: NO GROWTH
Culture: NO GROWTH

## 2022-03-30 LAB — MAGNESIUM: Magnesium: 1.8 mg/dL (ref 1.7–2.4)

## 2022-03-30 NOTE — Progress Notes (Signed)
[facility] NOTE       Patient ID: Brandi Reid MRN: [id number] DOB/AGE: 03-28-1927 89 y.o.  Admit date: 03/25/2022 Referring Physician Dr. Priscella Mann Primary Physician Gaetano Net, NP  Primary Cardiologist Dr. Saralyn Pilar Reason for Consultation AoCHF  HPI: Brandi Reid is a 89yoF with a PMH of HTN, nonrheumatic mod MR, mod TR, and AV sclerosis by echo 03/2019, left breast cancer s/p mastectomy, uterine cancer s/p TAH, chronic back pain, hx tobacco use who presented to [facility] ED 03/25/22 with severe shortness of breath and weakness. CTA chest with loculated L pleural effusion, concern for a malignant effusion. Echo this admission resulted with severe mitral and tricuspid valve regurgitation, and moderate aortic stenosis for which cardiology is consulted.   Interval History:  -No acute events -Continues to feel fatigued, no chest pain, shortness of breath, or worsening peripheral edema  Review of systems complete and found to be negative unless listed above     Past Medical History:  Diagnosis Date   Anemia    Breast cancer ([place]) 2005   left positive   Cancer ([place]) 2005   lt lumpectomy/chemo and rad   Complication of anesthesia    pt states she broke out in a sweat and felt like she was going to pass out during a dental procedure-no problems with surgeries though   GERD (gastroesophageal reflux disease)    History of hiatal hernia    Hypertension    Osteoarthritis, knee    Spinal stenosis    Uterine cancer ([place])     Past Surgical History:  Procedure Laterality Date   ABDOMINAL HYSTERECTOMY     APPENDECTOMY     age 72   BREAST BIOPSY Right 1980   neg   BREAST CYST EXCISION Right    BREAST LUMPECTOMY     CHOLECYSTECTOMY     KYPHOPLASTY N/A 10/30/2018   Procedure: L2 , L3 KYPHOPLASTY;  Surgeon: Hessie Knows, MD;  Location: [facility] ORS;  Service: Orthopedics;  Laterality: N/A;    Medications Prior to Admission  Medication Sig  Dispense Refill Last Dose   ADVIL DUAL ACTION 125-250 MG TABS Take 2 tablets by mouth every 8 (eight) hours as needed (pain.).   unk   cholecalciferol (VITAMIN D) 25 MCG (1000 UT) tablet Take 1,000 Units by mouth every evening.   03/24/2022   hydrochlorothiazide (HYDRODIURIL) 25 MG tablet Take 12.5 mg by mouth daily as needed.   unk   metoprolol tartrate (LOPRESSOR) 50 MG tablet Take 25 mg by mouth 2 (two) times daily.   03/25/2022 at 0830   Multiple Vitamins-Minerals (PRESERVISION AREDS 2 PO) Take 1 tablet by mouth 2 (two) times daily.   03/25/2022   Polyethyl Glycol-Propyl Glycol (LUBRICANT EYE DROPS) 0.4-0.3 % SOLN Place 1 drop into both eyes daily.   03/24/2022   HYDROcodone-acetaminophen (NORCO) 5-325 MG tablet Take 1 tablet by mouth every 6 (six) hours as needed. (Patient not taking: Reported on 03/25/2022) 15 tablet 0 Completed Course   Social History   Socioeconomic History   Marital status: Widowed    Spouse name: Not on file   Number of children: Not on file   Years of education: Not on file   Highest education level: Not on file  Occupational History   Not on file  Tobacco Use   Smoking status: Never   Smokeless tobacco: Never  Vaping Use   Vaping Use: Never used  Substance and Sexual Activity   Alcohol use: No   Drug  use: No   Sexual activity: Not on file  Other Topics Concern   Not on file  Social History Narrative   Not on file   Social Determinants of Health   Financial Resource Strain: Not on file  Food Insecurity: No Food Insecurity (03/26/2022)   Hunger Vital Sign    Worried About Running Out of Food in the Last Year: Never true    Ran Out of Food in the Last Year: Never true  Transportation Needs: No Transportation Needs (03/26/2022)   PRAPARE - Hydrologist (Medical): No    Lack of Transportation (Non-Medical): No  Physical Activity: Not on file  Stress: Not on file  Social Connections: Not on file  Intimate Partner Violence: Not  At Risk (03/26/2022)   Humiliation, Afraid, Rape, and Kick questionnaire    Fear of Current or Ex-Partner: No    Emotionally Abused: No    Physically Abused: No    Sexually Abused: No    Family History  Problem Relation Age of Onset   Breast cancer Daughter 28      Intake/Output Summary (Last 24 hours) at 03/30/2022 0857 Last data filed at 03/29/2022 1028 Gross per 24 hour  Intake 110 ml  Output --  Net 110 ml     Vitals:   03/29/22 2029 03/29/22 2335 03/30/22 0500 03/30/22 0537  BP: (!) 127/56 (!) 153/60  (!) 123/57  Pulse: 66 66  65  Resp: '18 16  16  '$ Temp: 98.5 F (36.9 C) (!) 97.5 F (36.4 C)  98.1 F (36.7 C)  TempSrc: Oral Oral  Oral  SpO2: 100% 97%  98%  Weight:   88.1 kg   Height:        PHYSICAL EXAM General: Pleasant thin elderly Caucasian female, in no acute distress.  Sitting upright in bed. HEENT:  Normocephalic and atraumatic.  Hard of hearing. Hearing aids present Neck:  no JVD.  Lungs: Normal respiratory effort on room air, bibasilar crackles. Aeration improving compared to yesterday Heart: HRRR . Normal S1 and S2 without gallops.  2/6 systolic murmur best heard at the apex. Abdomen: Non-distended appearing.  Msk: Normal strength and tone for age. Extremities: Warm and well perfused. No clubbing, cyanosis.  Trace bilateral LE edema, Ted hose in place Neuro: Alert and oriented X 3. Psych:  Answers questions appropriately.   Labs: Basic Metabolic Panel: Recent Labs    03/29/22 0420 03/30/22 0516  NA 141 139  K 4.9 4.1  CL 105 101  CO2 28 29  GLUCOSE 92 101*  BUN 26* 32*  CREATININE 1.03* 1.15*  CALCIUM 8.8* 8.7*  MG 2.0 1.8    Liver Function Tests: No results for input(s): "AST", "ALT", "ALKPHOS", "BILITOT", "PROT", "ALBUMIN" in the last 72 hours. No results for input(s): "LIPASE", "AMYLASE" in the last 72 hours. CBC: Recent Labs    03/28/22 0529  WBC 3.4*  NEUTROABS 1.4*  HGB 9.3*  HCT 28.5*  MCV 96.6  PLT 139*    Cardiac  Enzymes: No results for input(s): "CKTOTAL", "CKMB", "CKMBINDEX", "TROPONINIHS" in the last 72 hours.  BNP: No results for input(s): "BNP" in the last 72 hours.  D-Dimer: No results for input(s): "DDIMER" in the last 72 hours. Hemoglobin A1C: No results for input(s): "HGBA1C" in the last 72 hours. Fasting Lipid Panel: No results for input(s): "CHOL", "HDL", "LDLCALC", "TRIG", "CHOLHDL", "LDLDIRECT" in the last 72 hours. Thyroid Function Tests: No results for input(s): "TSH", "T4TOTAL", "T3FREE", "THYROIDAB" in  the last 72 hours.  Invalid input(s): "FREET3" Anemia Panel: No results for input(s): "VITAMINB12", "FOLATE", "FERRITIN", "TIBC", "IRON", "RETICCTPCT" in the last 72 hours.   Radiology: CT HEAD WO CONTRAST (5MM)  Result Date: 03/28/2022 CLINICAL DATA:  Neuro deficit, acute, stroke suspected. Generalized weakness. EXAM: CT HEAD WITHOUT CONTRAST TECHNIQUE: Contiguous axial images were obtained from the base of the skull through the vertex without intravenous contrast. RADIATION DOSE REDUCTION: This exam was performed according to the departmental dose-optimization program which includes automated exposure control, adjustment of the mA and/or kV according to patient size and/or use of iterative reconstruction technique. COMPARISON:  MRI brain 12/29/2010. FINDINGS: Brain: No acute hemorrhage. Unchanged chronic small-vessel disease. Cortical gray-white differentiation is otherwise preserved. Prominence of the ventricles and sulci within normal limits for age. No extra-axial collection. Basilar cisterns are patent. Vascular: No hyperdense vessel or unexpected calcification. Skull: No calvarial fracture or suspicious bone lesion. Skull base is unremarkable. Sinuses/Orbits: Unremarkable. Other: None. IMPRESSION: No acute intracranial abnormality. Electronically Signed   By: Emmit Alexanders M.D.   On: 03/28/2022 10:25   DG Chest Port 1 View  Result Date: 03/28/2022 CLINICAL DATA:  Status post  thoracentesis EXAM: PORTABLE CHEST 1 VIEW COMPARISON:  March 27, 2022 FINDINGS: Small bilateral pleural effusions are identified, left greater than right. The left-sided effusion appears a little smaller in the interval. No definite left-sided pneumothorax. The cardiomediastinal silhouette is stable with cardiomegaly. Spinal stimulator leads are stable. Probable mild pulmonary venous congestion without overt edema. Bibasilar atelectasis associated with the small effusions. No other changes. Significant carotid calcifications identified in the soft tissues of the neck. IMPRESSION: 1. Small bilateral pleural effusions, left greater than right. The left-sided effusion appears a little smaller in the interval. No pneumothorax. 2. Cardiomegaly and pulmonary venous congestion without overt edema. 3. Bibasilar atelectasis. 4. Significant carotid calcifications in the soft tissues of the neck. Electronically Signed   By: Dorise Bullion III M.D.   On: 03/28/2022 08:27   US THORACENTESIS ASP PLEURAL SPACE W/IMG GUIDE  Result Date: 03/27/2022 INDICATION: History of breast cancer and uterine cancer admitted for progressive shortness of breath and DOE with increasing weakness. Imaging found bilateral pleural effusions. Request received for diagnostic and therapeutic thoracentesis. EXAM: ULTRASOUND GUIDED DIAGNOSTIC AND THERAPEUTIC LEFT THORACENTESIS MEDICATIONS: 10 mL 1 % lidocaine COMPLICATIONS: None immediate. PROCEDURE: An ultrasound guided thoracentesis was thoroughly discussed with the patient and questions answered. The benefits, risks, alternatives and complications were also discussed. The patient understands and wishes to proceed with the procedure. Written consent was obtained. Ultrasound was performed to localize and mark an adequate pocket of fluid in the left chest. The area was then prepped and draped in the normal sterile fashion. 1% Lidocaine was used for local anesthesia. Under ultrasound guidance a 6 Fr  Safe-T-Centesis catheter was introduced. Thoracentesis was performed. The catheter was removed and a dressing applied. FINDINGS: A total of approximately 700 cc of clear, amber fluid was removed. Samples were sent to the laboratory as requested by the clinical team. IMPRESSION: Successful ultrasound guided left thoracentesis yielding 700 cc of pleural fluid. Read by: Narda Rutherford, AGNP-BC Electronically Signed   By: Ruthann Cancer M.D.   On: 03/27/2022 15:46   DG Chest Port 1 View  Result Date: 03/27/2022 CLINICAL DATA:  Status post left-sided thoracentesis. EXAM: PORTABLE CHEST 1 VIEW COMPARISON:  Chest x-ray 03/25/2022 FINDINGS: The heart is within normal limits in size given the AP projection and portable technique. Stable tortuosity and calcification of the thoracic  aorta. Slight decrease in size of the left pleural effusion. Suspect a small postprocedural pneumothorax. Recommend a follow-up chest x-ray to re-evaluate. Persistent right pleural effusion and pulmonary infiltrates. IMPRESSION: Interval decrease in size of left pleural effusion. Suspect a small postprocedural pneumothorax. Recommend a follow-up chest x-ray to re-evaluate. Electronically Signed   By: Marijo Sanes M.D.   On: 03/27/2022 15:36   [place] CHEST (PLEURAL EFFUSION)  Result Date: 03/26/2022 CLINICAL DATA:  Pleural effusion EXAM: CHEST ULTRASOUND COMPARISON:  None Available. FINDINGS: Focused sonographic exam of the right and left chest was performed for fluid assessment. Images of the right chest demonstrate a small and loculated pleural effusion. Images of the left foot demonstrate a moderate sized effusion with free fluid. IMPRESSION: 1. Moderate left pleural effusion with free fluid, amenable to image guided thoracentesis if desired. 2. Small and loculated right pleural effusion not amenable to image guided thoracentesis. 3. Patient declined thoracentesis at the time of this examination. Electronically Signed   By: Albin Felling M.D.    On: 03/26/2022 16:18   ECHOCARDIOGRAM COMPLETE  Result Date: 03/26/2022    ECHOCARDIOGRAM REPORT   Patient Name:   EDRIANA MOAN [facility] Date of Exam: 03/26/2022 Medical Rec #:  [id number]                  Height:       64.0 in Accession #:    [id number]                 Weight:       139.3 lb Date of Birth:  [birth year]                 BSA:          1.678 m Patient Age:    80 years                   BP:           182/80 mmHg Patient Gender: F                          HR:           79 bpm. Exam Location:  [facility] Procedure: 2D Echo, Cardiac Doppler and Color Doppler Indications:     CHF-acute diastolic [id number]  History:         Patient has no prior history of Echocardiogram examinations.                  Risk Factors:Hypertension.  Sonographer:     Sherrie Sport Referring Phys:  [id number] MICHAEL E NORINS Diagnosing Phys: Nelva Bush MD  Sonographer Comments: Image acquisition challenging due to patient body habitus. IMPRESSIONS  1. Left ventricular ejection fraction, by estimation, is 50 to 55%. The left ventricle has low normal function. The left ventricle has no regional wall motion abnormalities. There is moderate left ventricular hypertrophy. Left ventricular diastolic function could not be evaluated.  2. Right ventricular systolic function is normal. The right ventricular size is normal. Mildly increased right ventricular wall thickness. There is severely elevated pulmonary artery systolic pressure. The estimated right ventricular systolic pressure is [id number] mmHg.  3. Left atrial size was mildly dilated.  4. Moderate pleural effusion in the left lateral region.  5. The mitral valve is degenerative. Severe mitral valve regurgitation. No evidence of mitral stenosis. Severe mitral annular calcification.  6. Tricuspid valve regurgitation is severe.  7. The aortic valve has an  indeterminant number of cusps. There is moderate calcification of the aortic valve. There is severe thickening of the aortic valve. Aortic valve  regurgitation is not visualized. Moderate aortic valve stenosis. Aortic valve area, by VTI measures 0.88 cm. Aortic valve mean gradient measures 17.0 mmHg.  8. The inferior vena cava is dilated in size with <50% respiratory variability, suggesting right atrial pressure of 15 mmHg. FINDINGS  Left Ventricle: Left ventricular ejection fraction, by estimation, is 50 to 55%. The left ventricle has low normal function. The left ventricle has no regional wall motion abnormalities. The left ventricular internal cavity size was normal in size. There is moderate left ventricular hypertrophy. Left ventricular diastolic function could not be evaluated due to mitral annular calcification (moderate or greater). Left ventricular diastolic function could not be evaluated. Right Ventricle: The right ventricular size is normal. Mildly increased right ventricular wall thickness. Right ventricular systolic function is normal. There is severely elevated pulmonary artery systolic pressure. The tricuspid regurgitant velocity is 4.47 m/s, and with an assumed right atrial pressure of 15 mmHg, the estimated right ventricular systolic pressure is [id number] mmHg. Left Atrium: Left atrial size was mildly dilated. Right Atrium: Right atrial size was normal in size. Pericardium: There is no evidence of pericardial effusion. Mitral Valve: The mitral valve is degenerative in appearance. There is mild thickening of the mitral valve leaflet(s). Severe mitral annular calcification. Severe mitral valve regurgitation. No evidence of mitral valve stenosis. MV peak gradient, 6.5 mmHg. The mean mitral valve gradient is 3.0 mmHg. Tricuspid Valve: The tricuspid valve is not well visualized. Tricuspid valve regurgitation is severe. Aortic Valve: The aortic valve has an indeterminant number of cusps. There is moderate calcification of the aortic valve. There is severe thickening of the aortic valve. Aortic valve regurgitation is not visualized. Moderate aortic  stenosis is present. Aortic valve mean gradient measures 17.0 mmHg. Aortic valve peak gradient measures 24.4 mmHg. Aortic valve area, by VTI measures 0.88 cm. Pulmonic Valve: The pulmonic valve was not well visualized. Pulmonic valve regurgitation is mild. No evidence of pulmonic stenosis. Aorta: The aortic root is normal in size and structure. Pulmonary Artery: The pulmonary artery is not well seen. Venous: The inferior vena cava is dilated in size with less than 50% respiratory variability, suggesting right atrial pressure of 15 mmHg. IAS/Shunts: The interatrial septum was not well visualized. Additional Comments: There is a moderate pleural effusion in the left lateral region.  LEFT VENTRICLE PLAX 2D LVIDd:         3.80 cm   Diastology LVIDs:         2.80 cm   LV e' medial:    5.44 cm/s LV PW:         1.50 cm   LV E/e' medial:  16.4 LV IVS:        1.50 cm   LV e' lateral:   6.53 cm/s LVOT diam:     1.90 cm   LV E/e' lateral: 13.6 LV SV:         47 LV SV Index:   28 LVOT Area:     2.84 cm  RIGHT VENTRICLE RV Basal diam:  3.50 cm RV Mid diam:    3.20 cm RV S prime:     12.80 cm/s TAPSE (M-mode): 2.6 cm LEFT ATRIUM             Index        RIGHT ATRIUM           Index  LA diam:        4.10 cm 2.44 cm/m   RA Area:     13.70 cm LA Vol (A2C):   70.6 ml 42.08 ml/m  RA Volume:   33.30 ml  19.85 ml/m LA Vol (A4C):   51.7 ml 30.81 ml/m LA Biplane Vol: 63.4 ml 37.79 ml/m  AORTIC VALVE AV Area (Vmax):    0.84 cm AV Area (Vmean):   0.83 cm AV Area (VTI):     0.88 cm AV Vmax:           247.20 cm/s AV Vmean:          177.200 cm/s AV VTI:            0.533 m AV Peak Grad:      24.4 mmHg AV Mean Grad:      17.0 mmHg LVOT Vmax:         73.40 cm/s LVOT Vmean:        51.900 cm/s LVOT VTI:          0.166 m LVOT/AV VTI ratio: 0.31  AORTA Ao Root diam: 2.30 cm MITRAL VALVE               TRICUSPID VALVE MV Area (PHT): 4.41 cm    TR Peak grad:   79.9 mmHg MV Area VTI:   1.62 cm    TR Vmax:        447.00 cm/s MV Peak grad:  6.5  mmHg MV Mean grad:  3.0 mmHg    SHUNTS MV Vmax:       1.27 m/s    Systemic VTI:  0.17 m MV Vmean:      78.6 cm/s   Systemic Diam: 1.90 cm MV Decel Time: 172 msec MV E velocity: 89.10 cm/s MV A velocity: 96.40 cm/s MV E/A ratio:  0.92 Harrell Gave End MD Electronically signed by Nelva Bush MD Signature Date/Time: 03/26/2022/4:00:38 PM    Final    CT Angio Chest Pulmonary Embolism (PE) W or WO Contrast  Addendum Date: 03/25/2022   ADDENDUM REPORT: 03/25/2022 18:43 ADDENDUM: Effusion sizes are moderate on the LEFT with a larger sub pulmonic component just over the LEFT hemidiaphragm and small to moderate on the RIGHT with loculated areas as discussed. These results were called by telephone at the time of interpretation on 03/25/2022 at 6:42 pm to provider Dr. Nickolas Madrid, Who verbally acknowledged these results. Electronically Signed   By: Zetta Bills M.D.   On: 03/25/2022 18:43   Result Date: 03/25/2022 CLINICAL DATA:  89 year old female presents for evaluation of suspected pulmonary embolism with shortness of breath and bilateral leg swelling. History of uterine and breast cancer. * Tracking Code: BO * EXAM: CT ANGIOGRAPHY CHEST WITH CONTRAST TECHNIQUE: Multidetector CT imaging of the chest was performed using the standard protocol during bolus administration of intravenous contrast. Multiplanar CT image reconstructions and MIPs were obtained to evaluate the vascular anatomy. RADIATION DOSE REDUCTION: This exam was performed according to the departmental dose-optimization program which includes automated exposure control, adjustment of the mA and/or kV according to patient size and/or use of iterative reconstruction technique. CONTRAST:  106m OMNIPAQUE IOHEXOL 350 MG/ML SOLN COMPARISON:  None available FINDINGS: Cardiovascular: Calcified aortic atherosclerotic changes. Heart size moderately enlarged. Central pulmonary arteries are opacified to reported 83 Hounsfield units. There is no sign of venous  thromboembolism in the chest. Sequela of prior cement embolism present in the RIGHT lower lobe pulmonary artery without occlusive features or signs of surrounding inflammation.  Mediastinum/Nodes: Esophagus grossly normal. No discrete adenopathy in the chest. Soft tissue thickening over the RIGHT neck with signs of nodularity overlying the sternocleidomastoid muscle. Much of this could be due to presence of venous collaterals about the RIGHT neck and upper chest though this is not clear. Question of fullness of subcarinal nodal tissue and RIGHT paratracheal nodal tissue but these areas are difficult to assess due to dense contrast in the pulmonary vascular bed and in the SVC. No frank hilar nodal tissue with enlargement. Lungs/Pleura: Loculated pleural fluid throughout the chest with scalloped margins. No discrete pleural nodule is visible on this very early phase examination. Density of pleural fluid is low. Pleural fluid is loculated in the major fissure in the RIGHT chest tracking around the anterior RIGHT chest towards the level of the mid chest and seen dependently tracking posterior to the RIGHT heart and over the RIGHT hemidiaphragm. There is sub pulmonic fluid in the LEFT chest with volume loss in the LEFT lower lobe. Signs of septal thickening throughout the chest greatest at the lung bases where there is the greatest amount of pleural fluid. Small nodules at the LEFT lung base measuring 4 mm on image 92 and 93 respectively. Airways are patent. LEFT basilar consolidation and patchy areas of tree in bud. Upper Abdomen: Incidental imaging of upper abdominal contents shows no acute process to the extent that can be evaluated on this chest CT based on timing of the bolus and artifact. Musculoskeletal: Spinal nerve stimulator in place. No chest wall mass.  Spinal degenerative changes and osteopenia. Review of the MIP images confirms the above findings. IMPRESSION: 1. No sign of venous thromboembolism in the  chest. 2. 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 3. Loculated pleural fluid throughout the chest with scalloped margins. No discrete pleural nodule is visible on this very early phase examination. Given patient history of neoplasm this raises concern for malignant effusions. Effusions related to heart failure or volume overload would not be expected to have this loculated appearance. Post infectious pleural fluid would likely have greater enhancement. When viewed in the sagittal plane in the RIGHT chest there is frank nodularity though measuring lower density than expected for soft tissue along the major and minor fissures. 4. Patchy areas of ground-glass and septal thickening at the lung bases along with tree-in-bud nodularity could reflect mild pneumonitis. Would also correlate with signs of heart failure. In the current context follow-up will be helpful to ensure no signs of lymphangitic tumor. 5. LEFT basilar consolidation and patchy areas of tree in bud. Findings are concerning for pneumonia. 6. Soft tissue thickening over the RIGHT neck with signs of nodularity overlying the sternocleidomastoid muscle. Much of this could be due to presence of venous collaterals about the RIGHT neck and upper chest though this is not clear. Follow-up ultrasound may be helpful for further evaluation of this area. 7. Small nodules at the LEFT lung base measuring 4 mm respectively. Nonspecific findings, suggest attention on follow-up. 8. Aortic atherosclerosis. Aortic Atherosclerosis (ICD10-I70.0). Electronically Signed: By: Zetta Bills M.D. 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 Infarct would be felt unlikely given chronicity of cement embolization unless there was recent cement augmentation. On the lateral  projection convex margins seen in the area of the RIGHT middle lobe raise the question of underlying  mass. CT of the chest is suggested for further evaluation. Dense consolidation at the LEFT lower lobe and other airspace disease in the RIGHT chest concerning for pneumonia. These results were called by telephone at the time of interpretation on 03/25/2022 at 1:36 pm to provider Dr Joni Fears , who verbally acknowledged these results. Electronically Signed   By: Zetta Bills M.D.   On: 03/25/2022 13:36   Result Date: 03/25/2022 CLINICAL DATA:  Chest pain shortness of breath. EXAM: CHEST - 2 VIEW COMPARISON:  March 18, 2016.  No recent chest imaging. FINDINGS: Trachea is midline. Cardiomediastinal contours and hilar structures with mild cardiac enlargement. Dense tubular material at the RIGHT hilum new since previous imaging, interval kyphoplasty. Dense consolidation at the LEFT lung base is noted with small LEFT-sided pleural effusion. Small to moderate RIGHT-sided effusion with ovoid opacity in the RIGHT mid chest potentially loculated fluid or airspace process in the RIGHT mid chest peripheral areas of added density about the RIGHT hilum. Signs of spinal nerve stimulator in place, tips over the midthoracic spine. Faintly visualized changes recent kyphoplasty in upper lumbar spine. Osteopenia without acute skeletal process. IMPRESSION: 1. Findings suspicious for bilateral pneumonia or aspiration with small effusions. Would also correlate with signs heart failure. 2. Suspected cement embolization to the RIGHT lung. Ovoid opacity in the RIGHT chest could reflect pneumonia or infarct or loculated pleural fluid within the major fissure at the peripheral lung. CT may be helpful for further evaluation. 3. Suggest follow-up to ensure resolution of above findings. Electronically Signed: By: Zetta Bills M.D. On: 03/25/2022 13:20    ECHO   1. Left ventricular ejection fraction, by estimation, is 50 to 55%. The   left ventricle has low normal function. The left ventricle has no regional  wall motion abnormalities. There is moderate left ventricular hypertrophy.  Left ventricular diastolic  function could not be evaluated.   2. Right ventricular systolic function is normal. The right ventricular  size is normal. Mildly increased right ventricular wall thickness. There  is severely elevated pulmonary artery systolic pressure. The estimated  right ventricular systolic pressure  is [id number] mmHg.   3. Left atrial size was mildly dilated.   4. Moderate pleural effusion in the left lateral region.   5. The mitral valve is degenerative. Severe mitral valve regurgitation.  No evidence of mitral stenosis. Severe mitral annular calcification.   6. Tricuspid valve regurgitation is severe.   7. The aortic valve has an indeterminant number of cusps. There is  moderate calcification of the aortic valve. There is severe thickening of  the aortic valve. Aortic valve regurgitation is not visualized. Moderate  aortic valve stenosis. Aortic valve  area, by VTI measures 0.88 cm. Aortic valve mean gradient measures 17.0  mmHg.   8. The inferior vena cava is dilated in size with <50% respiratory  variability, suggesting right atrial pressure of 15 mmHg.   03/17/2019 NORMAL LEFT VENTRICULAR SYSTOLIC FUNCTION   WITH MILD LVH  NORMAL RIGHT VENTRICULAR SYSTOLIC FUNCTION  MODERATE VALVULAR REGURGITATION (See above)  NO VALVULAR STENOSIS  MODERATE TR  SCLEROTIC AoV  MILD MR  EF 55%   TELEMETRY reviewed by me (LT) 03/30/2022 : NSR 60-70  EKG reviewed by me: NSR rate 74  Data reviewed by me (LT) 03/30/2022: , hospitalist progress note last 24h vitals tele labs imaging I/O   Principal Problem:   Loculated pleural effusion Active Problems:   SOB (shortness of breath)   HTN (hypertension)   GERD (gastroesophageal reflux  disease)   CHF (congestive heart failure) ([place])   Do not intubate, cardiopulmonary resuscitation  (CPR)-only code status    ASSESSMENT AND PLAN:  Oliviah C. Bartoe is a 89yoF with a PMH of HTN, nonrheumatic mod MR, mod TR, and AV sclerosis by echo 03/2019, left breast cancer s/p mastectomy, uterine cancer s/p TAH, chronic back pain, hx tobacco use who presented to [facility] ED 03/25/22 with severe shortness of breath and weakness. CTA chest with loculated L pleural effusion, concern for a malignant effusion. Echo this admission resulted with severe mitral and tricuspid valve regurgitation, and moderate aortic stenosis for which cardiology is consulted.   # Loculated pleural effusions Seen on initial CTA chest with concern for malignant effusions, s/p thoracentesis 2/27. Cytology pending   # Acute on chronic HFpEF (EF 50-55%, moderate LVH) # Severe MR, TR # Moderate aortic stenosis Valve regurg worsening since 2021 by echo, previously managed with as needed Lasix 20 mg once daily which the patient admits to not taking recently.  Presents with 3-4 weeks of profound weakness and fatigue, dyspnea on exertion and peripheral edema.  Clinically hypervolemic with 1+ pitting edema bilaterally and an oxygen requirement, BNP of 3100 on admisison. Weaned to room air 2/28, remains clinically euvolemic today.  -stop further IV diuresis. continue lasix '20mg'$  daily  -continue with metoprolol tartrate 25 mg twice daily, losartan 25 mg once daily -stop amlodipine with orthostasis -Monitor and replete electrolytes for a K >4, mag >2 - optimize volume status, unlikely a candidate for surgical valve repair due to advanced age. Will also await pleural fluid cytology  # Demand ischemia Borderline elevated HS-Tn flat trending at 32, 32 in the absence of chest pain or EKG changes this is most consistent with demand/supply mismatch and not ACS.  Cardiology will sign off. Please haiku with questions or re-engage if needed. Will arrange for follow up in clinic with Dr. Saralyn Pilar in 1-2 weeks.    This patient's plan of  care was discussed and created with Dr. Saralyn Pilar and he is in agreement.  Signed: Tristan Schroeder , PA-C 03/30/2022, 8:57 AM [facility]

## 2022-03-30 NOTE — Progress Notes (Signed)
Progress Note   Patient: Brandi Reid [id number] DOB: 1927-04-26 DOA: 03/25/2022     5 DOS: the patient was seen and examined on 03/30/2022    Brief Narrative:  89 y/o independent woman with h/o breast cancer and uterine cancer remotely reports progressive SOB/DOE and increasing weakness over the past month or more. Her last OV PCP 01/31/22 she was stable and doing well. Other medical problems include HTN, OA knees, GERD, chronic anemia, spinal stenosis, h/o lumbar compression fracture s/p kyphoplasty, chronic back pain with failed implanted stimulator. She now presents due to severe SOB and weakness.  S/p thoracentesis cytology results pending   Assessment & Plan:   Principal Problem:   Loculated pleural effusion Active Problems:   SOB (shortness of breath)   CHF (congestive heart failure) (HCC)   HTN (hypertension)   GERD (gastroesophageal reflux disease)   Do not intubate, cardiopulmonary resuscitation (CPR)-only code status   Loculated pleural effusion Loculated pleural effusion of unclear etiology.   Patient s/p IR guided thoracentesis with removal of 700 mL of fluid Plan: Follow-up on cytology No indication for chest tube   CHF (congestive heart failure) ([place]), suspect diastolic Patient w/o prior h/o CHF now with elevated BNP, abnormal CXR and CTA suggestive of pulmonary edema, infection unlikely w/o fever, productive cough or leukocytosis. She endorses progressive SOB/DOE over at least the past month along with peripheral edema. She denies rigors, fever at home, any chest pain.  Plan: Echocardiogram report reviewed showing ejection fraction of 50 to 55%. Diastolic function could not be evaluated Lasix decreased to 20 mg p.o. daily Continue beta-blocker, added ARB, add amlodipine Strict ins and outs, daily weights Cardiology is on board we appreciate input   GERD (gastroesophageal reflux disease) No active symptoms and she takes no medication Daily PPI for  stress ulcer prophylaxis   HTN (hypertension) Patient has developed HTN in her later years.  Beta-blocker and ARB amlodipine   CODE STATUS  DNR/DNI    DVT prophylaxis: SQ heparin  Family Communication: Daughter via phone 2/26, 2/27  Disposition Plan: Status is: Inpatient  Remains inpatient appropriate because: Loculated pleural effusion, hypoxia, shortness of breath.  Status post thoracentesis.  Respiratory status improved.  Awaiting cytology.       Subjective: Patient seen and examined at bedside this morning She tells me her shortness of breath is getting better She is however worried about the cytology report which is still pending  Physical Exam: Vitals:   03/30/22 0537 03/30/22 0957 03/30/22 1140 03/30/22 1527  BP: (!) 123/57 128/68 (!) 144/40 (!) 139/46  Pulse: 65 69 62 65  Resp: '16 17 18 17  '$ Temp: 98.1 F (36.7 C) 98.2 F (36.8 C) 98.5 F (36.9 C) 98.2 F (36.8 C)  TempSrc: Oral Oral Oral   SpO2: 98% 99% 99% 99%  Weight:      Height:       General exam: No acute distress Respiratory system: Mild bibasilar crackles.  Normal work of breathing Cardiovascular system: [id number], RRR, 2/6 systolic murmur, no pedal edema Gastrointestinal system: Thin, soft, NT/ND, normal bowel sounds Central nervous system: Alert, oriented x 2 no focal deficits Extremities: Symmetric 5 x 5 power. Skin: No rashes, lesions or ulcers Psychiatry: Judgement and insight appear normal. Mood & affect appropriate.   Data Reviewed:  BMP results reviewed by me  Family Communication: Plan of care discussed with patient  Disposition: Status is: Inpatient   Planned Discharge Destination: Pending cytology of pleural fluid results  Time spent: 35 minutes  Author: Verline Lema, MD 03/30/2022 4:46 PM  For on call review [email or web address].

## 2022-03-30 NOTE — Progress Notes (Signed)
Daily Progress Note   Patient Name: Brandi Reid       Date: 03/30/2022 DOB: Feb 06, 1927  Age: 89 y.o. MRN#: [id number] Attending Physician: Verline Lema, MD Primary Care Physician: Sallee Lange, NP Admit Date: 03/25/2022  Reason for Consultation/Follow-up: Establishing goals of care  Subjective: Notes and labs reviewed including cytology results.  Patient is sitting in bed, looking towards discharge home.  Goals set for DNR/DNI.  Continue to treat treatable.  Length of Stay: 5  Current Medications: Scheduled Meds:   furosemide  20 mg Oral Daily   heparin  5,000 Units Subcutaneous Q8H   lidocaine (PF)  10 mL Intradermal Once   losartan  25 mg Oral Daily   metoprolol tartrate  25 mg Oral BID   polyvinyl alcohol  1 drop Both Eyes Daily   senna  1 tablet Oral BID    Continuous Infusions:   PRN Meds: acetaminophen **OR** acetaminophen  Physical Exam Pulmonary:     Effort: Pulmonary effort is normal.  Neurological:     Mental Status: She is alert.             Vital Signs: BP (!) 144/40 (BP Location: Right Arm)   Pulse 62   Temp 98.5 F (36.9 C) (Oral)   Resp 18   Ht '5\' 4"'$  (1.626 m)   Wt 88.1 kg   SpO2 99%   BMI 33.34 kg/m  SpO2: SpO2: 99 % O2 Device: O2 Device: Room Air O2 Flow Rate: O2 Flow Rate (L/min): 2 L/min  Intake/output summary: No intake or output data in the 24 hours ending 03/30/22 1240 LBM: Last BM Date : 03/27/22 Baseline Weight: Weight: 56.7 kg Most recent weight: Weight: 88.1 kg    Patient Active Problem List   Diagnosis Date Noted   SOB (shortness of breath) 03/25/2022   Loculated pleural effusion 03/25/2022   HTN (hypertension) 03/25/2022   GERD (gastroesophageal reflux disease) 03/25/2022   CHF (congestive heart  failure) ([place]) 03/25/2022   Do not intubate, cardiopulmonary resuscitation (CPR)-only code status 03/25/2022    Palliative Care Assessment & Plan     Recommendations/Plan: DNR/DNI. Continue current care.  PMT will shadow for decline.     Code Status:    Code Status Orders  (From admission, onward)  Start     Ordered   03/25/22 2112  Do not attempt resuscitation (DNR)  Continuous       Question Answer Comment  If patient has no pulse and is not breathing Do Not Attempt Resuscitation   If patient has a pulse and/or is breathing: Medical Treatment Goals LIMITED ADDITIONAL INTERVENTIONS: Use medication/IV fluids and cardiac monitoring as indicated; Do not use intubation or mechanical ventilation (DNI), also provide comfort medications.  Transfer to Progressive/Stepdown as indicated, avoid Intensive Care.   Consent: Discussion documented in EHR or advanced directives reviewed      03/25/22 2111           Code Status History     Date Active Date Inactive Code Status Order ID Comments User Context   03/25/2022 2034 03/25/2022 2111 Full Code [id number]  Neena Rhymes, MD ED       Prognosis:  Unable to determine    Thank you for allowing the Palliative Medicine Team to assist in the care of this patient.    Asencion Gowda, NP  Please contact Palliative Medicine Team phone at [phone] for questions and concerns.

## 2022-03-31 LAB — CBC WITH DIFFERENTIAL/PLATELET
Abs Immature Granulocytes: 0 10*3/uL (ref 0.00–0.07)
Basophils Absolute: 0 10*3/uL (ref 0.0–0.1)
Basophils Relative: 1 %
Eosinophils Absolute: 0.1 10*3/uL (ref 0.0–0.5)
Eosinophils Relative: 4 %
HCT: 29.3 % — ABNORMAL LOW (ref 36.0–46.0)
Hemoglobin: 9.5 g/dL — ABNORMAL LOW (ref 12.0–15.0)
Immature Granulocytes: 0 %
Lymphocytes Relative: 60 %
Lymphs Abs: 2.1 10*3/uL (ref 0.7–4.0)
MCH: 31.8 pg (ref 26.0–34.0)
MCHC: 32.4 g/dL (ref 30.0–36.0)
MCV: 98 fL (ref 80.0–100.0)
Monocytes Absolute: 0.2 10*3/uL (ref 0.1–1.0)
Monocytes Relative: 7 %
Neutro Abs: 1 10*3/uL — ABNORMAL LOW (ref 1.7–7.7)
Neutrophils Relative %: 28 %
Platelets: 110 10*3/uL — ABNORMAL LOW (ref 150–400)
RBC: 2.99 MIL/uL — ABNORMAL LOW (ref 3.87–5.11)
RDW: 15.6 % — ABNORMAL HIGH (ref 11.5–15.5)
WBC: 3.5 10*3/uL — ABNORMAL LOW (ref 4.0–10.5)
nRBC: 0 % (ref 0.0–0.2)

## 2022-03-31 LAB — BODY FLUID CULTURE W GRAM STAIN
Culture: NO GROWTH
Gram Stain: NONE SEEN

## 2022-03-31 LAB — BASIC METABOLIC PANEL
Anion gap: 8 (ref 5–15)
BUN: 33 mg/dL — ABNORMAL HIGH (ref 8–23)
CO2: 28 mmol/L (ref 22–32)
Calcium: 8.8 mg/dL — ABNORMAL LOW (ref 8.9–10.3)
Chloride: 101 mmol/L (ref 98–111)
Creatinine, Ser: 1.09 mg/dL — ABNORMAL HIGH (ref 0.44–1.00)
GFR, Estimated: 48 mL/min — ABNORMAL LOW (ref >60)
Glucose, Bld: 90 mg/dL (ref 70–99)
Potassium: 3.8 mmol/L (ref 3.5–5.1)
Sodium: 137 mmol/L (ref 135–145)

## 2022-03-31 MED ORDER — FUROSEMIDE 20 MG PO TABS: 20.0000 mg | ORAL_TABLET | Freq: Every day | ORAL | 0 refills | Status: DC

## 2022-03-31 MED ORDER — LOSARTAN POTASSIUM 25 MG PO TABS: 25.0000 mg | ORAL_TABLET | Freq: Every day | ORAL | 0 refills | Status: DC

## 2022-03-31 MED ORDER — SENNA 8.6 MG PO TABS: 1.0000 | ORAL_TABLET | Freq: Two times a day (BID) | ORAL | 0 refills | Status: DC

## 2022-03-31 MED ORDER — FUROSEMIDE 20 MG PO TABS: 20.0000 mg | ORAL_TABLET | Freq: Every day | ORAL | 11 refills | Status: DC

## 2022-03-31 MED ORDER — LOSARTAN POTASSIUM 25 MG PO TABS: 25.0000 mg | ORAL_TABLET | Freq: Every day | ORAL | 11 refills | Status: DC

## 2022-03-31 NOTE — TOC Progression Note (Addendum)
Transition of Care [facility]) - Progression Note    Patient Details  Name: Juhi Tieu MRN: [id number] Date of Birth: July 17, 1927  [facility] CM/SW Contact  Izola Price, RN Phone Number: 03/31/2022, 11:53 AM  Clinical Narrative:  3/2: Discharging today with recommended PT/OT and other services. Spoke with patient and she handed phone to son. Has not used HH before and no preference. No DME ordered though patient stays she has a RW at her office from her late husband if needed. Son says patient was living alone prior to this admission but they will provide transportation and assistance in her home for a few days/week to see how she does.  PCP and Pharmacy confirmed as in chart. [facility] can take for PT/RN, and have requested order change for this if agreeable to provider for discharge. Simmie Davies RN CM          Expected Discharge Plan and Services                                               Social Determinants of Health (SDOH) Interventions SDOH Screenings   Food Insecurity: No Food Insecurity (03/26/2022)  Housing: Low Risk  (03/26/2022)  Transportation Needs: No Transportation Needs (03/26/2022)  Utilities: Not At Risk (03/26/2022)  Tobacco Use: Low Risk  (03/26/2022)    Readmission Risk Interventions     No data to display

## 2022-03-31 NOTE — Progress Notes (Unsigned)
Patient ID: Brandi Reid, female    DOB: [birth year], 89 y.o.   MRN: [id number]  HPI  Brandi Reid is a 89 y/o female with a history of HTN, breast/ uterine cancer, anemia, GERD, spinal stenosis and chronic heart failure.   Echo 03/26/22 showed an EF of 50-55% along with moderate LVH, mild LAE, severely elevated PA pressure of 94.9 mmHg, left moderate pleural effusion, severe MR/ TR and moderate AS.  Admitted 03/25/22 due to SOB/weakness due to pleural effusion and heart failure. Thoracentesis removed '700mg'$ l fluid.   She presents today for her initial visit with a chief complaint of minimal fatigue upon moderate exertion. Describes this as chronic in nature. Has associated SOB, pedal edema, chronic back pain, weakness and light-headedness along with this. Denies any difficulty sleeping, abdominal distention, palpitations, chest pain or cough.   Has scales but didn't realize that she should be weighing herself every day. Not adding salt to her food and her son has been trying to read food labels for sodium content.   She didn't realize that she was supposed to resume taking her metoprolol at discharge so will resume this today.   Past Medical History:  Diagnosis Date   Anemia    Breast cancer ([place]) 2005   left positive   Cancer ([place]) 2005   lt lumpectomy/chemo and rad   CHF (congestive heart failure) ([place])    Complication of anesthesia    pt states she broke out in a sweat and felt like she was going to pass out during a dental procedure-no problems with surgeries though   GERD (gastroesophageal reflux disease)    History of hiatal hernia    Hypertension    Osteoarthritis, knee    Spinal stenosis    Uterine cancer ([place])    Past Surgical History:  Procedure Laterality Date   ABDOMINAL HYSTERECTOMY     APPENDECTOMY     age 30   BREAST BIOPSY Right 1980   neg   BREAST CYST EXCISION Right    BREAST LUMPECTOMY     CHOLECYSTECTOMY     KYPHOPLASTY N/A 10/30/2018    Procedure: L2 , L3 KYPHOPLASTY;  Surgeon: Hessie Knows, MD;  Location: [facility] ORS;  Service: Orthopedics;  Laterality: N/A;   Family History  Problem Relation Age of Onset   Breast cancer Daughter 67   Social History   Tobacco Use   Smoking status: Never   Smokeless tobacco: Never  Substance Use Topics   Alcohol use: No   Allergies  Allergen Reactions   Compazine [Prochlorperazine] Anaphylaxis   Penicillins Anaphylaxis    Did it involve swelling of the face/tongue/throat, SOB, or low BP? Yes Did it involve sudden or severe rash/hives, skin peeling, or any reaction on the inside of your mouth or nose? No Did you need to seek medical attention at a hospital or doctor's office? Yes When did it last happen? More than 30-40 years ago If all above answers are "NO", may proceed with cephalosporin use.    Prior to Admission medications   Medication Sig Start Date End Date Taking? Authorizing Provider  cholecalciferol (VITAMIN D) 25 MCG (1000 UT) tablet Take 1,000 Units by mouth every evening.   Yes [provider]  furosemide (LASIX) 20 MG tablet Take 1 tablet (20 mg total) by mouth daily. 03/31/22 03/31/23 Yes Verline Lema, MD  losartan (COZAAR) 25 MG tablet Take 1 tablet (25 mg total) by mouth daily. 03/31/22 03/31/23 Yes Verline Lema, MD  metoprolol  tartrate (LOPRESSOR) 50 MG tablet Take 25 mg by mouth 2 (two) times daily.   Yes [provider]  Multiple Vitamins-Minerals (PRESERVISION AREDS 2 PO) Take 1 tablet by mouth 2 (two) times daily.   Yes [provider]  Polyethyl Glycol-Propyl Glycol (LUBRICANT EYE DROPS) 0.4-0.3 % SOLN Place 1 drop into both eyes daily.   Yes [provider]  ADVIL DUAL ACTION 125-250 MG TABS Take 2 tablets by mouth every 8 (eight) hours as needed (pain.).    [provider]  hydrochlorothiazide (HYDRODIURIL) 25 MG tablet Take 12.5 mg by mouth daily as needed.    [provider]    Review of Systems   Constitutional:  Positive for fatigue. Negative for appetite change.  HENT:  Negative for congestion and sore throat.        Dry mouth  Eyes: Negative.   Respiratory:  Positive for shortness of breath. Negative for cough and chest tightness.   Cardiovascular:  Positive for leg swelling. Negative for chest pain and palpitations.  Gastrointestinal:  Negative for abdominal distention and abdominal pain.  Endocrine: Negative.   Genitourinary: Negative.   Musculoskeletal:  Positive for back pain. Negative for neck pain.  Skin: Negative.   Allergic/Immunologic: Negative.   Neurological:  Positive for weakness and light-headedness. Negative for dizziness.  Hematological:  Negative for adenopathy. Does not bruise/bleed easily.  Psychiatric/Behavioral:  Negative for dysphoric mood and sleep disturbance. The patient is not nervous/anxious.     Vitals:   04/02/22 1523  BP: (!) 133/52  Pulse: 71  SpO2: 100%  Weight: 126 lb (57.2 kg)   Wt Readings from Last 3 Encounters:  04/02/22 126 lb (57.2 kg)  03/31/22 126 lb 8.7 oz (57.4 kg)  10/28/18 135 lb 9.3 oz (61.5 kg)   Lab Results  Component Value Date   CREATININE 1.09 (H) 03/31/2022   CREATININE 1.15 (H) 03/30/2022   CREATININE 1.03 (H) 03/29/2022    Physical Exam Vitals and nursing note reviewed. Exam conducted with a chaperone present (son).  Constitutional:      Appearance: Normal appearance.  HENT:     Head: Normocephalic and atraumatic.  Cardiovascular:     Rate and Rhythm: Normal rate and regular rhythm.     Heart sounds: Murmur (II/VI) heard.  Pulmonary:     Effort: Pulmonary effort is normal. No respiratory distress.     Breath sounds: No wheezing or rales.  Abdominal:     General: There is no distension.     Palpations: Abdomen is soft.     Tenderness: There is no abdominal tenderness.  Musculoskeletal:        General: No tenderness.     Cervical back: Normal range of motion and neck supple.     Right lower leg:  Edema (1+ pitting) present.     Left lower leg: Edema (1+ pitting) present.  Skin:    General: Skin is warm and dry.  Neurological:     General: No focal deficit present.     Mental Status: She is alert and oriented to person, place, and time.  Psychiatric:        Mood and Affect: Mood normal.        Behavior: Behavior normal.        Thought Content: Thought content normal.   Assessment & Plan:  1: Chronic heart failure with preserved ejection fraction with LVH/LAE- - NYHA class II - euvolemic - has scales and will start weighing daily; instructed to call for  an overnight weight gain of > 2 pounds or a weekly weight gain of > 5 pounds - echo 03/26/22 showed an EF of 50-55% along with moderate LVH, mild LAE, severely elevated PA pressure of 94.9 mmHg, left moderate pleural effusion, severe MR/ TR and moderate AS. - not adding salt and son is reading food labels for sodium content of foods - unsure of fluid intake so instructed to drink 60-64 ounces daily - recent thoracentesis with removal of 700 ml fluid - lasix '20mg'$  daily - losartan '25mg'$  daily - metoprolol tartrate '25mg'$  BID; resuming this today - start jardiance '10mg'$  daily; 30 day voucher provided - BMP next visit - unable to tolerate compression socks; encouraged her to elevate her legs when sitting for long periods of time - BNP 03/26/22 was 3106.5  2: HTN- - BP 133/52 - saw PCP ([place]) 01/31/22 - BMP 03/31/22 showed sodium 137, potassium 3.8, creatinine 1.09 and GFR 48  3: Anemia- - hemoglobin 03/31/22 was 9.5  4: Valvular disease- - furosemide '20mg'$  daily - saw cardiology ([place]) 09/11/21 - unlikely a candidate for valve repair due to age; could consider referral to surgeon for further discussion if she would like   Medication bottles reviewed.   Return in 3 weeks, sooner if needed.

## 2022-03-31 NOTE — Discharge Summary (Signed)
Physician Discharge Summary   Patient: Brandi Reid MRN: [id number] DOB: 05/29/1927  Admit date:     03/25/2022  Discharge date: 03/31/22  Discharge Physician: Verline Lema   PCP: Sallee Lange, NP    Admission diagnosis:  Loculated pleural effusion  Acute on chronic diastolic CHF (congestive heart failure)  GERD (gastroesophageal reflux disease) HTN (hypertension)  Discharge Diagnoses:  Loculated pleural effusion s/p IR guided thoracentesis Acute on chronic diastolic CHF (congestive heart failure)  GERD (gastroesophageal reflux disease) HTN (hypertension)   Hospital Course:  89 y/o independent woman with h/o breast cancer and uterine cancer remotely reports progressive SOB/DOE and increasing weakness over the past month or more. Her last OV PCP 01/31/22 she was stable and doing well. Other medical problems include HTN, OA knees, GERD, chronic anemia, spinal stenosis, h/o lumbar compression fracture s/p kyphoplasty, chronic back pain with failed implanted stimulator. She now presents due to severe SOB and weakness.  S/p IR guided thoracentesis, cytology negative for malignant cells.  Gram stain and culture negative.  Patient was also seen by cardiologist on account of acute on chronic HFpEF.  Medications were adjusted by cardiologist and continued at discharge. patient's vital stable laboratory reviewed today within normal limits.  Case discussed with case worker and will be discharged to home health for weight physical therapy.       Consultants: IR, cardiology Procedures performed: IR guided thoracentesis Disposition: Home health Diet recommendation:  Discharge Diet Orders (From admission, onward)     Start     Ordered   03/31/22 0000  Diet - low sodium heart healthy        03/31/22 1340           Cardiac diet DISCHARGE MEDICATION: Allergies as of 03/31/2022       Reactions   Compazine [prochlorperazine] Anaphylaxis   Penicillins Anaphylaxis    Did it involve swelling of the face/tongue/throat, SOB, or low BP? Yes Did it involve sudden or severe rash/hives, skin peeling, or any reaction on the inside of your mouth or nose? No Did you need to seek medical attention at a hospital or doctor's office? Yes When did it last happen? More than 30-40 years ago If all above answers are "NO", may proceed with cephalosporin use.        Medication List     STOP taking these medications    HYDROcodone-acetaminophen 5-325 MG tablet Commonly known as: Norco       TAKE these medications    Advil Dual Action 125-250 MG Tabs Generic drug: Ibuprofen-Acetaminophen Take 2 tablets by mouth every 8 (eight) hours as needed (pain.).   cholecalciferol 25 MCG (1000 UT) tablet Generic drug: Cholecalciferol Take 1,000 Units by mouth every evening.   furosemide 20 MG tablet Commonly known as: LASIX Take 1 tablet (20 mg total) by mouth daily. Start taking on: April 01, 2022   hydrochlorothiazide 25 MG tablet Commonly known as: HYDRODIURIL Take 12.5 mg by mouth daily as needed.   losartan 25 MG tablet Commonly known as: COZAAR Take 1 tablet (25 mg total) by mouth daily. Start taking on: April 01, 2022   Lubricant Eye Drops 0.4-0.3 % Soln Generic drug: Polyethyl Glycol-Propyl Glycol Place 1 drop into both eyes daily.   metoprolol tartrate 50 MG tablet Commonly known as: LOPRESSOR Take 25 mg by mouth 2 (two) times daily.   PRESERVISION AREDS 2 PO Take 1 tablet by mouth 2 (two) times daily.        Follow-up  Information     Paraschos, Alexander, MD. Go in 1 week(s).   Specialty: Cardiology Contact information: [facility] [address] [place] [address] [phone]                Discharge Exam: Danley Danker Weights   03/29/22 0500 03/30/22 0500 03/31/22 0500  Weight: 57.1 kg 88.1 kg 57.4 kg   General exam: No acute distress Respiratory system: Normal work of breathing Cardiovascular  system: [id number], RRR, 2/6 systolic murmur, no pedal edema Gastrointestinal system: Thin, soft, NT/ND, normal bowel sounds Central nervous system: Alert, oriented x 2 no focal deficits Extremities: Symmetric 5 x 5 power. Skin: No rashes, lesions or ulcers Psychiatry: Judgement and insight appear normal. Mood & affect appropriate.   Condition at discharge: good Discharge time spent: greater than 30 minutes.  Signed: Verline Lema, MD [facility] 03/31/2022

## 2022-03-31 NOTE — TOC Transition Note (Signed)
Transition of Care [facility]) - CM/SW Discharge Note   Patient Details  Name: Shaunte Deliman MRN: [id number] Date of Birth: [birth year]  [facility]) CM/SW Contact:  Izola Price, RN Phone Number: 03/31/2022, 11:58 AM   Clinical Narrative:  03/31/22: Updated provider and [place] orders to be changed to PT/RN and [facility], per Mortimer Fries A., will accept for service on discharge. Start of service likely Monday or Tuesday. Son indicated family will assist staying with patient on discharge to see how she does. Simmie Davies RN CM     Final next level of care: [place] Barriers to Discharge: Barriers Resolved   Patient Goals and CMS Choice      Discharge Placement                         Discharge Plan and Services Additional resources added to the After Visit Summary for                  DME Arranged: N/A DME Agency: NA       HH Arranged: RN, PT HH Agency: [place] Date [place]: 03/31/22 Time [place]: [id number] Representative spoke with at [place]: [place] Interventions SDOH Screenings   Food Insecurity: No Food Insecurity (03/26/2022)  Housing: Low Risk  (03/26/2022)  Transportation Needs: No Transportation Needs (03/26/2022)  Utilities: Not At Risk (03/26/2022)  Tobacco Use: Low Risk  (03/26/2022)     Readmission Risk Interventions     No data to display

## 2022-03-31 NOTE — Progress Notes (Signed)
Mobility Specialist - Progress Note    03/31/22 1151  Mobility  Activity Ambulated with assistance in hallway  Level of Assistance Modified independent, requires aide device or extra time  Assistive Device Front wheel walker  Distance Ambulated (ft) 150 ft  Activity Response Tolerated well  Mobility Referral Yes  $Mobility charge 1 Mobility   Pt OOB upon arrival on RA. Pt STS and ambulates to hallway ModI with AD. Pt returned to bed and left with needs in reach and bed alarm activated.    Loma Sender Mobility Specialist 03/31/22, 1:01 PM

## 2022-03-31 NOTE — Progress Notes (Signed)
Mobility Specialist - Progress Note     03/31/22 1100  Mobility  Activity Ambulated independently to bathroom  Level of Assistance Modified independent, requires aide device or extra time  Assistive Device Front wheel walker  Distance Ambulated (ft) 10 ft  Activity Response Tolerated well  Mobility Referral Yes  $Mobility charge 1 Mobility   Pt resting in bed on RA upon entry. Pt STS and ambulates to bathroom ModI with AD. Pt returned to bed and left with needs in reach and bed alarm activated. Family friend present in room.   Loma Sender Mobility Specialist 03/31/22, 12:59 PM

## 2022-04-02 ENCOUNTER — Ambulatory Visit: Payer: Medicare Other | Attending: Family | Admitting: Family

## 2022-04-02 ENCOUNTER — Encounter: Payer: Self-pay | Admitting: Family

## 2022-04-02 VITALS — BP 133/52 | HR 71 | Wt 126.0 lb

## 2022-04-02 DIAGNOSIS — M549 Dorsalgia, unspecified: Secondary | ICD-10-CM | POA: Diagnosis not present

## 2022-04-02 DIAGNOSIS — Z79899 Other long term (current) drug therapy: Secondary | ICD-10-CM | POA: Diagnosis not present

## 2022-04-02 DIAGNOSIS — I5032 Chronic diastolic (congestive) heart failure: Secondary | ICD-10-CM | POA: Diagnosis present

## 2022-04-02 DIAGNOSIS — Z803 Family history of malignant neoplasm of breast: Secondary | ICD-10-CM | POA: Diagnosis not present

## 2022-04-02 DIAGNOSIS — D649 Anemia, unspecified: Secondary | ICD-10-CM | POA: Diagnosis not present

## 2022-04-02 DIAGNOSIS — I110 Hypertensive heart disease with heart failure: Secondary | ICD-10-CM | POA: Insufficient documentation

## 2022-04-02 DIAGNOSIS — I10 Essential (primary) hypertension: Secondary | ICD-10-CM | POA: Diagnosis not present

## 2022-04-02 DIAGNOSIS — Z853 Personal history of malignant neoplasm of breast: Secondary | ICD-10-CM | POA: Insufficient documentation

## 2022-04-02 DIAGNOSIS — I340 Nonrheumatic mitral (valve) insufficiency: Secondary | ICD-10-CM | POA: Diagnosis not present

## 2022-04-02 DIAGNOSIS — J90 Pleural effusion, not elsewhere classified: Secondary | ICD-10-CM | POA: Diagnosis not present

## 2022-04-02 DIAGNOSIS — Z8542 Personal history of malignant neoplasm of other parts of uterus: Secondary | ICD-10-CM | POA: Insufficient documentation

## 2022-04-02 DIAGNOSIS — G8929 Other chronic pain: Secondary | ICD-10-CM | POA: Diagnosis not present

## 2022-04-02 MED ORDER — EMPAGLIFLOZIN 10 MG PO TABS: 10.0000 mg | ORAL_TABLET | Freq: Every day | ORAL | 5 refills | Status: AC

## 2022-04-02 NOTE — Patient Instructions (Addendum)
Start weighing daily and call for an overnight weight gain of 3 pounds or more or a weekly weight gain of more than 5 pounds.   Start taking jardiance as 1 tablet every day.

## 2022-04-16 DIAGNOSIS — I5031 Acute diastolic (congestive) heart failure: Secondary | ICD-10-CM | POA: Insufficient documentation

## 2022-04-23 ENCOUNTER — Encounter: Payer: Medicare Other | Admitting: Family

## 2022-04-23 ENCOUNTER — Telehealth: Payer: Self-pay | Admitting: Family

## 2022-04-23 DIAGNOSIS — D649 Anemia, unspecified: Secondary | ICD-10-CM | POA: Insufficient documentation

## 2022-04-23 NOTE — Progress Notes (Deleted)
Patient ID: Brandi Reid, female    DOB: [birth year], 89 y.o.   MRN: [id number]  HPI  Brandi Reid is a 89 y/o female with a history of HTN, breast/ uterine cancer, anemia, GERD, spinal stenosis and chronic heart failure.   Echo 03/26/22: EF of 50-55% along with moderate LVH, mild LAE, severely elevated PA pressure of 94.9 mmHg, left moderate pleural effusion, severe MR/ TR and moderate AS.  Admitted 03/25/22 due to SOB/weakness due to pleural effusion and heart failure. Thoracentesis removed 700mg l fluid.   She presents today for a HF f/u visit with a chief complaint of   Past Medical History:  Diagnosis Date   Anemia    Breast cancer ([place]) 2005   left positive   Cancer ([place]) 2005   lt lumpectomy/chemo and rad   CHF (congestive heart failure) ([place])    Complication of anesthesia    pt states she broke out in a sweat and felt like she was going to pass out during a dental procedure-no problems with surgeries though   GERD (gastroesophageal reflux disease)    History of hiatal hernia    Hypertension    Osteoarthritis, knee    Spinal stenosis    Uterine cancer ([place])    Past Surgical History:  Procedure Laterality Date   ABDOMINAL HYSTERECTOMY     APPENDECTOMY     age 82   BREAST BIOPSY Right 1980   neg   BREAST CYST EXCISION Right    BREAST LUMPECTOMY     CHOLECYSTECTOMY     KYPHOPLASTY N/A 10/30/2018   Procedure: L2 , L3 KYPHOPLASTY;  Surgeon: Hessie Knows, MD;  Location: [facility] ORS;  Service: Orthopedics;  Laterality: N/A;   Family History  Problem Relation Age of Onset   Breast cancer Daughter 66   Social History   Tobacco Use   Smoking status: Never   Smokeless tobacco: Never  Substance Use Topics   Alcohol use: No   Allergies  Allergen Reactions   Compazine [Prochlorperazine] Anaphylaxis   Penicillins Anaphylaxis    Did it involve swelling of the face/tongue/throat, SOB, or low BP? Yes Did it involve sudden or severe rash/hives, skin peeling,  or any reaction on the inside of your mouth or nose? No Did you need to seek medical attention at a hospital or doctor's office? Yes When did it last happen? More than 30-40 years ago If all above answers are "NO", may proceed with cephalosporin use.      Review of Systems  Constitutional:  Positive for fatigue. Negative for appetite change.  HENT:  Negative for congestion and sore throat.        Dry mouth  Eyes: Negative.   Respiratory:  Positive for shortness of breath. Negative for cough and chest tightness.   Cardiovascular:  Positive for leg swelling. Negative for chest pain and palpitations.  Gastrointestinal:  Negative for abdominal distention and abdominal pain.  Endocrine: Negative.   Genitourinary: Negative.   Musculoskeletal:  Positive for back pain. Negative for neck pain.  Skin: Negative.   Allergic/Immunologic: Negative.   Neurological:  Positive for weakness and light-headedness. Negative for dizziness.  Hematological:  Negative for adenopathy. Does not bruise/bleed easily.  Psychiatric/Behavioral:  Negative for dysphoric mood and sleep disturbance. The patient is not nervous/anxious.        Physical Exam Vitals and nursing note reviewed. Exam conducted with a chaperone present (son).  Constitutional:      Appearance: Normal appearance.  HENT:  Head: Normocephalic and atraumatic.  Cardiovascular:     Rate and Rhythm: Normal rate and regular rhythm.     Heart sounds: Murmur (II/VI) heard.  Pulmonary:     Effort: Pulmonary effort is normal. No respiratory distress.     Breath sounds: No wheezing or rales.  Abdominal:     General: There is no distension.     Palpations: Abdomen is soft.     Tenderness: There is no abdominal tenderness.  Musculoskeletal:        General: No tenderness.     Cervical back: Normal range of motion and neck supple.     Right lower leg: Edema (1+ pitting) present.     Left lower leg: Edema (1+ pitting) present.  Skin:     General: Skin is warm and dry.  Neurological:     General: No focal deficit present.     Mental Status: She is alert and oriented to person, place, and time.  Psychiatric:        Mood and Affect: Mood normal.        Behavior: Behavior normal.        Thought Content: Thought content normal.   Assessment & Plan:  1: Chronic heart failure with preserved ejection fraction with LVH/LAE- - NYHA class II - euvolemic - has scales and will start weighing daily; instructed to call for an overnight weight gain of > 2 pounds or a weekly weight gain of > 5 pounds - weight 126 pounds from last visit here 3 weeks ago - echo 03/26/22: EF of 50-55% along with moderate LVH, mild LAE, severely elevated PA pressure of 94.9 mmHg, left moderate pleural effusion, severe MR/ TR and moderate AS. - not adding salt and son is reading food labels for sodium content of foods - unsure of fluid intake so instructed to drink 60-64 ounces daily - previous thoracentesis with removal of 700 ml fluid - lasix 20mg  daily - losartan 25mg  daily - metoprolol tartrate 25mg  BID - jardiance 10mg  daily - BMP today - unable to tolerate compression socks; encouraged her to elevate her legs when sitting for long periods of time - BNP 03/26/22 was 3106.5  2: HTN- - BP  - saw PCP ([facility]) 04/12/22 - BMP 03/31/22 showed sodium 137, potassium 3.8, creatinine 1.09 and GFR 48  3: Anemia- - hemoglobin 03/31/22 was 9.5  4: Valvular disease- - furosemide 20mg  daily - saw cardiology ([place]) 04/16/22 - unlikely a candidate for valve repair due to age; could consider referral to surgeon for further discussion if she would like

## 2022-04-23 NOTE — Telephone Encounter (Signed)
Patient did not show for her [facility] Failure [facility] appointment on 04/23/22.

## 2022-04-24 ENCOUNTER — Telehealth: Payer: Self-pay

## 2022-04-24 NOTE — Telephone Encounter (Signed)
Pt's daughter left message for the clinic that patient missed her appt yesterday (04/23/22) because they were taking her to the hospital in [place], where patient has been admitted.  Pt's daughter states they will call to reschedule once her mother is discharged home.

## 2022-06-18 ENCOUNTER — Other Ambulatory Visit: Payer: Self-pay

## 2022-06-18 ENCOUNTER — Emergency Department: Payer: Medicare Other

## 2022-06-18 ENCOUNTER — Inpatient Hospital Stay
Admission: EM | Admit: 2022-06-18 | Discharge: 2022-06-20 | DRG: 683 | Disposition: A | Discharge status: Home / Self Care | Payer: Medicare Other | Source: Ambulatory Visit | Attending: Internal Medicine | Admitting: Internal Medicine

## 2022-06-18 DIAGNOSIS — E860 Dehydration: Secondary | ICD-10-CM | POA: Diagnosis present

## 2022-06-18 DIAGNOSIS — Z90710 Acquired absence of both cervix and uterus: Secondary | ICD-10-CM

## 2022-06-18 DIAGNOSIS — I5032 Chronic diastolic (congestive) heart failure: Secondary | ICD-10-CM | POA: Diagnosis present

## 2022-06-18 DIAGNOSIS — N1831 Chronic kidney disease, stage 3a: Secondary | ICD-10-CM | POA: Diagnosis present

## 2022-06-18 DIAGNOSIS — I130 Hypertensive heart and chronic kidney disease with heart failure and stage 1 through stage 4 chronic kidney disease, or unspecified chronic kidney disease: Secondary | ICD-10-CM | POA: Diagnosis present

## 2022-06-18 DIAGNOSIS — I509 Heart failure, unspecified: Secondary | ICD-10-CM

## 2022-06-18 DIAGNOSIS — Z853 Personal history of malignant neoplasm of breast: Secondary | ICD-10-CM

## 2022-06-18 DIAGNOSIS — E876 Hypokalemia: Secondary | ICD-10-CM | POA: Diagnosis present

## 2022-06-18 DIAGNOSIS — Z8542 Personal history of malignant neoplasm of other parts of uterus: Secondary | ICD-10-CM | POA: Diagnosis not present

## 2022-06-18 DIAGNOSIS — A09 Infectious gastroenteritis and colitis, unspecified: Secondary | ICD-10-CM | POA: Diagnosis present

## 2022-06-18 DIAGNOSIS — Z66 Do not resuscitate: Secondary | ICD-10-CM | POA: Diagnosis present

## 2022-06-18 DIAGNOSIS — D631 Anemia in chronic kidney disease: Secondary | ICD-10-CM | POA: Diagnosis present

## 2022-06-18 DIAGNOSIS — N179 Acute kidney failure, unspecified: Principal | ICD-10-CM | POA: Diagnosis present

## 2022-06-18 DIAGNOSIS — Z79899 Other long term (current) drug therapy: Secondary | ICD-10-CM | POA: Diagnosis not present

## 2022-06-18 DIAGNOSIS — Z7984 Long term (current) use of oral hypoglycemic drugs: Secondary | ICD-10-CM

## 2022-06-18 DIAGNOSIS — Z888 Allergy status to other drugs, medicaments and biological substances status: Secondary | ICD-10-CM | POA: Diagnosis not present

## 2022-06-18 DIAGNOSIS — G8929 Other chronic pain: Secondary | ICD-10-CM | POA: Diagnosis present

## 2022-06-18 DIAGNOSIS — Z880 Allergy status to penicillin: Secondary | ICD-10-CM | POA: Diagnosis not present

## 2022-06-18 DIAGNOSIS — Z803 Family history of malignant neoplasm of breast: Secondary | ICD-10-CM

## 2022-06-18 DIAGNOSIS — D72819 Decreased white blood cell count, unspecified: Secondary | ICD-10-CM | POA: Diagnosis present

## 2022-06-18 DIAGNOSIS — R197 Diarrhea, unspecified: Principal | ICD-10-CM

## 2022-06-18 DIAGNOSIS — M1990 Unspecified osteoarthritis, unspecified site: Secondary | ICD-10-CM | POA: Diagnosis present

## 2022-06-18 DIAGNOSIS — K219 Gastro-esophageal reflux disease without esophagitis: Secondary | ICD-10-CM | POA: Diagnosis present

## 2022-06-18 DIAGNOSIS — M4800 Spinal stenosis, site unspecified: Secondary | ICD-10-CM | POA: Diagnosis present

## 2022-06-18 DIAGNOSIS — I10 Essential (primary) hypertension: Secondary | ICD-10-CM | POA: Diagnosis present

## 2022-06-18 LAB — COMPREHENSIVE METABOLIC PANEL
ALT: 8 U/L (ref 0–44)
AST: 17 U/L (ref 15–41)
Albumin: 3.8 g/dL (ref 3.5–5.0)
Alkaline Phosphatase: 32 U/L — ABNORMAL LOW (ref 38–126)
Anion gap: 8 (ref 5–15)
BUN: 35 mg/dL — ABNORMAL HIGH (ref 8–23)
CO2: 25 mmol/L (ref 22–32)
Calcium: 8.8 mg/dL — ABNORMAL LOW (ref 8.9–10.3)
Chloride: 103 mmol/L (ref 98–111)
Creatinine, Ser: 1.67 mg/dL — ABNORMAL HIGH (ref 0.44–1.00)
GFR, Estimated: 29 mL/min — ABNORMAL LOW (ref >60)
Glucose, Bld: 143 mg/dL — ABNORMAL HIGH (ref 70–99)
Potassium: 2.8 mmol/L — ABNORMAL LOW (ref 3.5–5.1)
Sodium: 136 mmol/L (ref 135–145)
Total Bilirubin: 0.8 mg/dL (ref 0.3–1.2)
Total Protein: 6.2 g/dL — ABNORMAL LOW (ref 6.5–8.1)

## 2022-06-18 LAB — CBC WITH DIFFERENTIAL/PLATELET
Abs Immature Granulocytes: 0.01 10*3/uL (ref 0.00–0.07)
Basophils Absolute: 0 10*3/uL (ref 0.0–0.1)
Basophils Relative: 1 %
Eosinophils Absolute: 0.1 10*3/uL (ref 0.0–0.5)
Eosinophils Relative: 2 %
HCT: 33.1 % — ABNORMAL LOW (ref 36.0–46.0)
Hemoglobin: 10.8 g/dL — ABNORMAL LOW (ref 12.0–15.0)
Immature Granulocytes: 0 %
Lymphocytes Relative: 57 %
Lymphs Abs: 2.2 10*3/uL (ref 0.7–4.0)
MCH: 30.9 pg (ref 26.0–34.0)
MCHC: 32.6 g/dL (ref 30.0–36.0)
MCV: 94.8 fL (ref 80.0–100.0)
Monocytes Absolute: 0.4 10*3/uL (ref 0.1–1.0)
Monocytes Relative: 9 %
Neutro Abs: 1.2 10*3/uL — ABNORMAL LOW (ref 1.7–7.7)
Neutrophils Relative %: 31 %
Platelets: 150 10*3/uL (ref 150–400)
RBC: 3.49 MIL/uL — ABNORMAL LOW (ref 3.87–5.11)
RDW: 14.7 % (ref 11.5–15.5)
WBC: 3.9 10*3/uL — ABNORMAL LOW (ref 4.0–10.5)
nRBC: 0 % (ref 0.0–0.2)

## 2022-06-18 LAB — LIPASE, BLOOD: Lipase: 28 U/L (ref 11–51)

## 2022-06-18 LAB — MAGNESIUM: Magnesium: 2 mg/dL (ref 1.7–2.4)

## 2022-06-18 MED ORDER — LEVOFLOXACIN IN D5W 750 MG/150ML IV SOLN
750.0000 mg | Freq: Once | INTRAVENOUS | Status: DC
  Filled 2022-06-18: qty 150

## 2022-06-18 MED ORDER — POTASSIUM CHLORIDE CRYS ER 20 MEQ PO TBCR
40.0000 meq | EXTENDED_RELEASE_TABLET | Freq: Once | ORAL | Status: AC
  Administered 2022-06-18: 40 meq via ORAL
  Filled 2022-06-18: qty 2

## 2022-06-18 MED ORDER — POTASSIUM CHLORIDE 10 MEQ/100ML IV SOLN
10.0000 meq | INTRAVENOUS | Status: AC
  Administered 2022-06-18 (×2): 10 meq via INTRAVENOUS
  Filled 2022-06-18: qty 100

## 2022-06-18 MED ORDER — LEVOFLOXACIN IN D5W 500 MG/100ML IV SOLN
500.0000 mg | Freq: Once | INTRAVENOUS | Status: AC
  Administered 2022-06-18: 500 mg via INTRAVENOUS
  Filled 2022-06-18: qty 100

## 2022-06-18 MED ORDER — METRONIDAZOLE 500 MG/100ML IV SOLN
500.0000 mg | Freq: Once | INTRAVENOUS | Status: AC
  Administered 2022-06-18: 500 mg via INTRAVENOUS
  Filled 2022-06-18: qty 100

## 2022-06-18 MED ORDER — SODIUM CHLORIDE 0.9 % IV BOLUS
1000.0000 mL | Freq: Once | INTRAVENOUS | Status: AC
  Administered 2022-06-18: 1000 mL via INTRAVENOUS

## 2022-06-18 NOTE — Consult Note (Signed)
PHARMACY -  BRIEF ANTIBIOTIC NOTE   Pharmacy has received consult(s) for levofloxacin from an ED provider.  The patient's profile has been reviewed for ht/wt/allergies/indication/available labs.    One time order(s) placed for  Levofloxacin 500 mg   Further antibiotics/pharmacy consults should be ordered by admitting physician if indicated.                       Thank you, Sharen Hones, PharmD, BCPS Clinical Pharmacist   06/18/2022  9:21 PM

## 2022-06-18 NOTE — ED Provider Triage Note (Signed)
Emergency Medicine Provider Triage Evaluation Note  Brandi Reid, a 89 y.o. female  was evaluated in triage.  Pt complains of abdominal pain, bloating, and diarrhea.  She presents to the ED from [facility], where she presented for evaluation of her symptoms.  They noted an AKI and dehydration on her labs.  Review of Systems  Positive: Abd pain, bloating, diarrhea Negative: FCS  Physical Exam  BP 134/63 (BP Location: Right Arm)   Pulse 69   Temp 97.9 F (36.6 C) (Oral)   Resp 18   SpO2 96%  Gen:   Awake, no distress  NAD Resp:  Normal effort  MSK:   Moves extremities without difficulty  ABD:  Soft and nontender  Medical Decision Making  Medically screening exam initiated at 4:31 PM.  Appropriate orders placed.  Brandi Reid was informed that the remainder of the evaluation will be completed by another provider, this initial triage assessment does not replace that evaluation, and the importance of remaining in the ED until their evaluation is complete.  Geriatric patient to the ED for evaluation of abdominal bloating and diarrhea for the last few days.   Brandi Hoard, PA-C 06/18/22 [phone]

## 2022-06-18 NOTE — H&P (Signed)
History and Physical    Brandi Reid ZOX:[id number] DOB: March 17, 1927 DOA: 06/18/2022  PCP: Myrene Buddy, NP  Patient coming from: [facility] Chief Complaint: Abdominal pain, diarrhea  HPI: Brandi Reid is a 89 y.o. female with medical history significant of chronic anemia, breast cancer, h/o CHF with EF of 55%, GERD, HTN, OA, spinal stenosis, h/o uterine cancer who presents for a week of diarrhea, urinary symptoms and fatigue.  She notes that her symptoms started about a week ago with diarrhea.  She has no known sick contacts, no recent change to diet or travel and no recent antibiotics (she was admitted in March and had oral levaquin for a presumed pneumonia).  She notes up to 10 bowel movements per day, at times mucus like, no blood, gassy and uncomfortable abdomen.  No nausea, no true abdominal pain, no vomiting.  She didn't feel much like eating, but was able to keep fluids down.  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  She has a history of spinal stenosis and chronic back pain.  She further reports some mild dysuria and lower abdominal discomfort.  She has been having less urine output.   ED Course: In the ED, she had a CT of the colon which showed mild colitis.  She had a mild AKI on CKD 3a, K was 3.8.  Initially thought to have UTI, however, review of UA from [facility] clinic today showed a bland urine without any white cells.  She was initiated on antibiotics for colitis with levaquin and flagyl. She has a chronic low WBC and a chronic anemia as well.   Review of Systems: As per HPI otherwise all other systems reviewed and are negative.   Past Medical History:  Diagnosis Date   Anemia    Breast cancer (HCC) 2005   left positive   Cancer (HCC) 2005   lt lumpectomy/chemo and rad   CHF (congestive heart failure) (HCC)    Complication of anesthesia    pt states she broke out in a sweat and felt like she was going to pass out during a  dental procedure-no problems with surgeries though   GERD (gastroesophageal reflux disease)    History of hiatal hernia    Hypertension    Osteoarthritis, knee    Spinal stenosis    Uterine cancer (HCC)     Past Surgical History:  Procedure Laterality Date   ABDOMINAL HYSTERECTOMY     APPENDECTOMY     age 49   BREAST BIOPSY Right 1980   neg   BREAST CYST EXCISION Right    BREAST LUMPECTOMY     CHOLECYSTECTOMY     KYPHOPLASTY N/A 10/30/2018   Procedure: L2 , L3 KYPHOPLASTY;  Surgeon: Kennedy Bucker, MD;  Location: [facility] ORS;  Service: Orthopedics;  Laterality: N/A;    Social History  reports that she has never smoked. She has never used smokeless tobacco. She reports that she does not drink alcohol and does not use drugs.  Allergies  Allergen Reactions   Compazine [Prochlorperazine] Anaphylaxis   Penicillins Anaphylaxis    Did it involve swelling of the face/tongue/throat, SOB, or low BP? Yes Did it involve sudden or severe rash/hives, skin peeling, or any reaction on the inside of your mouth or nose? No Did you need to seek medical attention at a hospital or doctor's office? Yes When did it last happen? More than 30-40 years ago If all above answers are "NO", may proceed with cephalosporin  use.     Family History  Problem Relation Age of Onset   Breast cancer Daughter 29     Prior to Admission medications   Medication Sig Start Date End Date Taking? Authorizing Provider  ADVIL DUAL ACTION 125-250 MG TABS Take 2 tablets by mouth every 8 (eight) hours as needed (pain.).    [provider]  cholecalciferol (VITAMIN D) 25 MCG (1000 UT) tablet Take 1,000 Units by mouth every evening.    [provider]  empagliflozin (JARDIANCE) 10 MG TABS tablet Take 1 tablet (10 mg total) by mouth daily before breakfast. 04/02/22   Delma Freeze, FNP  furosemide (LASIX) 20 MG tablet Take 1 tablet (20 mg total) by mouth daily. 03/31/22 03/31/23  Loyce Dys, MD   hydrochlorothiazide (HYDRODIURIL) 25 MG tablet Take 12.5 mg by mouth daily as needed.    [provider]  losartan (COZAAR) 25 MG tablet Take 1 tablet (25 mg total) by mouth daily. 03/31/22 03/31/23  Loyce Dys, MD  metoprolol tartrate (LOPRESSOR) 50 MG tablet Take 25 mg by mouth 2 (two) times daily.    [provider]  Multiple Vitamins-Minerals (PRESERVISION AREDS 2 PO) Take 1 tablet by mouth 2 (two) times daily.    [provider]  Polyethyl Glycol-Propyl Glycol (LUBRICANT EYE DROPS) 0.4-0.3 % SOLN Place 1 drop into both eyes daily.    [provider]    Physical Exam: Vitals:   06/18/22 1830 06/18/22 1900 06/18/22 2000 06/18/22 2100  BP: (!) 146/69 (!) 175/55 (!) 188/66   Pulse: 63 65    Resp:      Temp:      TempSrc:      SpO2: 100% 100%    Weight:    50.7 kg    Constitutional: NAD, calm, comfortable Eyes: lids and conjunctivae normal ENMT: Mucous membranes are dry Neck: normal, supple Respiratory: CTAB, no wheezing, no rales Cardiovascular: RR, NR, no murmur.  Pulses intact and 2+ bilaterally in the radial arteries, she has very mild to trace pedal edema Abdomen: scaphoid, mild TTP, +BS Musculoskeletal: normal bulk and tone for age.  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  Skin: no rashes, lesions, ulcers on exposed skin, some mild senile purpura, decreased skin turgor noted at the chest wall.  Neurologic: Moving easily in bed, hard of hearing, speech is normal.  Psychiatric: Normal judgment and insight. Alert and oriented x 3. Normal mood.    Labs on Admission: I have personally reviewed following labs and imaging studies  CBC: Recent Labs  Lab 06/18/22 1907  WBC 3.9*  NEUTROABS 1.2*  HGB 10.8*  HCT 33.1*  MCV 94.8  PLT 150    Basic Metabolic Panel: Recent Labs  Lab 06/18/22 1907  NA 136  K 2.8*  CL 103  CO2 25  GLUCOSE 143*  BUN 35*  CREATININE 1.67*  CALCIUM 8.8*    GFR: Estimated  Creatinine Clearance: 17.2 mL/min (A) (by C-G formula based on SCr of 1.67 mg/dL (H)).  Liver Function Tests: Recent Labs  Lab 06/18/22 1907  AST 17  ALT 8  ALKPHOS 32*  BILITOT 0.8  PROT 6.2*  ALBUMIN 3.8    Urine analysis:    Component Value Date/Time   COLORURINE Yellow 02/16/2013 2125   APPEARANCEUR Clear 02/16/2013 2125   LABSPEC 1.018 02/16/2013 2125   PHURINE 6.0 02/16/2013 2125   GLUCOSEU Negative 02/16/2013 2125   HGBUR Negative 02/16/2013 2125   BILIRUBINUR Negative 02/16/2013  2125   KETONESUR Trace 02/16/2013 2125   PROTEINUR 30 mg/dL 16/10/9602 5409   NITRITE Negative 02/16/2013 2125   LEUKOCYTESUR 3+ 02/16/2013 2125    Radiological Exams on Admission: CT ABDOMEN PELVIS WO CONTRAST  Result Date: 06/18/2022 CLINICAL DATA:  Abdomen pain bloating and diarrhea EXAM: CT ABDOMEN AND PELVIS WITHOUT CONTRAST TECHNIQUE: Multidetector CT imaging of the abdomen and pelvis was performed following the standard protocol without IV contrast. RADIATION DOSE REDUCTION: This exam was performed according to the departmental dose-optimization program which includes automated exposure control, adjustment of the mA and/or kV according to patient size and/or use of iterative reconstruction technique. COMPARISON:  CT chest 03/25/2022 FINDINGS: Lower chest: Lung bases demonstrate no acute airspace disease. Mild reticular densities at the right base likely due to scarring. Cardiomegaly. Hepatobiliary: Cholecystectomy. No focal hepatic abnormality. Prominent common bile duct measuring up to 12 mm likely due to postsurgical change. Pancreas: Unremarkable. No pancreatic ductal dilatation or surrounding inflammatory changes. Spleen: Borderline enlarged with craniocaudal measurement of 12 cm Adrenals/Urinary Tract: Adrenal glands are normal. Kidneys show no hydronephrosis. The bladder is unremarkable Stomach/Bowel: The stomach is nonenlarged. No dilated small bowel. Mild diffuse fluid distension of the  colon corresponding to history of diarrhea. Suspicion of mild diffuse colon wall thickening as may be seen with colitis. Vascular/Lymphatic: Advanced aortic atherosclerosis. No aneurysm. No suspicious lymph nodes. Reproductive: Status post hysterectomy. No adnexal masses. Other: Negative for pelvic effusion or free air. Musculoskeletal: Scoliosis and degenerative changes of the spine. IMPRESSION: 1. Mild diffuse fluid distension of the colon corresponding to history of diarrhea. Suspicion of mild diffuse colon wall thickening as may be seen with colitis. 2. Aortic atherosclerosis. Aortic Atherosclerosis (ICD10-I70.0). Electronically Signed   By: Jasmine Pang M.D.   On: 06/18/2022 19:45    EKG: not done  Assessment/Plan  AKI (acute kidney injury) - Received 1L fluid bolus in the ED - IVF with LR at 75cc/hr for 10 hours - Recheck renal function in the AM - Likely related to diarrhea and poor PO intake - Hold advil, jardiance, losartan - Tylenol for pain or fever  Hypokalemia - Check magnesium - Replaced IV - Will order some oral potassium as well - Recheck in the AM  Acute colitis, mild - EDP started levaquin and flagyl - It seems she is actually improving with the amount of diarrhea she is having - Would hold further antibiotics and monitor for improvement  HTN (hypertension) - She is on losartan and metoprolol - Hold losartan - Hydralazine PRN for elevated blood pressues    CHF (congestive heart failure) with preserved EF - She is on jardiance, metoprolol, losartan - Hold ARB and jardiance in the setting of AKI - Monitor for volume overload    Spinal stenosis - Tylenol and oxycodone sparingly for pain if needed   DVT prophylaxis: Lovenox  Code Status:   DNR (golden form reviewed in [place])  Family Communication:  Grandson at bedside  Disposition Plan:   Patient is from:  Home  Anticipated DC to:  TBD  Anticipated DC date:  06/20/22  Anticipated DC barriers: Improved  diarrhea and kidney function  Consults called:  NOne  Admission status:  IP, Med surg   Severity of Illness: The appropriate patient status for this patient is INPATIENT. Inpatient status is judged to be reasonable and necessary in order to provide the required intensity of service to ensure the patient's safety. The patient's presenting symptoms, physical exam findings, and initial radiographic and laboratory data in  the context of their chronic comorbidities is felt to place them at high risk for further clinical deterioration. Furthermore, it is not anticipated that the patient will be medically stable for discharge from the hospital within 2 midnights of admission.   * I certify that at the point of admission it is my clinical judgment that the patient will require inpatient hospital care spanning beyond 2 midnights from the point of admission due to high intensity of service, high risk for further deterioration and high frequency of surveillance required.Debe Coder MD [facility]  How to contact the [facility] Attending or Consulting provider 7A - 7P or covering provider during after hours 7P -7A, for this patient?   Check the care team in [facility] and look for a) attending/consulting [facility] provider listed and b) the [facility] team listed Log into [email or web address] and use [facility]'s universal password to access. If you do not have the password, please contact the hospital operator. Locate the [facility] provider you are looking for under [facility] and page to a number that you can be directly reached. If you still have difficulty reaching the provider, please page the [facility] (Director on Call) for the Hospitalists listed on amion for assistance.  06/18/2022, 9:52 PM

## 2022-06-18 NOTE — ED Provider Notes (Signed)
[facility] Provider Note    Event Date/Time   First MD Initiated Contact with Patient 06/18/22 1808     (approximate)   History   Diarrhea   HPI  Brandi Reid is a 89 y.o. female presenting to the emergency department for evaluation of diarrhea.  Patient reports provide for the last few days she has had frequent episodes of nonbloody diarrhea.  She called her primary care office today and was directed to a walk-in clinic.  There, she had lab work performed demonstrating AKI with a creatinine of 1.7 and mild hypokalemia with a K of 3.3.  She was directed to the ER for further evaluation.  She reports some generalized abdominal pain.  Denies nausea or vomiting.       Physical Exam   Triage Vital Signs: ED Triage Vitals [06/18/22 1621]  Enc Vitals Group     BP 134/63     Pulse Rate 69     Resp 18     Temp 97.9 F (36.6 C)     Temp Source Oral     SpO2 96 %     Weight      Height      Head Circumference      Peak Flow      Pain Score 0     Pain Loc      Pain Edu?      Excl. in GC?     Most recent vital signs: Vitals:   06/18/22 1900 06/18/22 2000  BP: (!) 175/55 (!) 188/66  Pulse: 65   Resp:    Temp:    SpO2: 100%      General: Awake, interactive  CV:  Regular rate, good peripheral perfusion.  Resp:  Lungs clear, unlabored respirations.  Abd:  Soft, mild tenderness to palpation diffusely without rebound or guarding Neuro:  Symmetric facial movement, fluid speech, hearing impaired   ED Results / Procedures / Treatments   Labs (all labs ordered are listed, but only abnormal results are displayed) Labs Reviewed  CBC WITH DIFFERENTIAL/PLATELET - Abnormal; Notable for the following components:      Result Value   WBC 3.9 (*)    RBC 3.49 (*)    Hemoglobin 10.8 (*)    HCT 33.1 (*)    Neutro Abs 1.2 (*)    All other components within normal limits  COMPREHENSIVE METABOLIC PANEL - Abnormal; Notable for the following  components:   Potassium 2.8 (*)    Glucose, Bld 143 (*)    BUN 35 (*)    Creatinine, Ser 1.67 (*)    Calcium 8.8 (*)    Total Protein 6.2 (*)    Alkaline Phosphatase 32 (*)    GFR, Estimated 29 (*)    All other components within normal limits  LIPASE, BLOOD  MAGNESIUM     EKG EKG independently reviewed interpreted by myself (ER attending) demonstrates:    RADIOLOGY Imaging independently reviewed and interpreted by myself demonstrates:    PROCEDURES:  Critical Care performed: Yes, see critical care procedure note(s)  CRITICAL CARE  Performed by: Trinna Post  Total critical care time: 30 minutes  Critical care time was exclusive of separately billable procedures and treating other patients.  Critical care was necessary to treat or prevent imminent or life-threatening deterioration.  Critical care was time spent personally by me on the following activities: development of treatment plan with patient and/or surrogate as well as nursing, discussions with consultants, evaluation of  patient's response to treatment, examination of patient, obtaining history from patient or surrogate, ordering and performing treatments and interventions, ordering and review of laboratory studies, ordering and review of radiographic studies, pulse oximetry and re-evaluation of patient's condition.  Procedures   MEDICATIONS ORDERED IN ED: Medications  potassium chloride 10 mEq in 100 mL IVPB (10 mEq Intravenous New Bag/Given 06/18/22 2125)  sodium chloride 0.9 % bolus 1,000 mL (0 mLs Intravenous Stopped 06/18/22 2144)  potassium chloride SA (KLOR-CON M) CR tablet 40 mEq (40 mEq Oral Given 06/18/22 1939)  metroNIDAZOLE (FLAGYL) IVPB 500 mg (500 mg Intravenous New Bag/Given 06/18/22 2128)  levofloxacin (LEVAQUIN) IVPB 500 mg (500 mg Intravenous New Bag/Given 06/18/22 2140)     IMPRESSION / MDM / ASSESSMENT AND PLAN / ED COURSE  I reviewed the triage vital signs and the nursing notes.  Differential  diagnosis includes, but is not limited to, dehydration secondary to diarrhea, electrolyte abnormality, colitis, diverticulitis, other acute intra-abdominal process  Patient's presentation is most consistent with acute presentation with potential threat to life or bodily function.  89 year old female presenting to the emergency department for evaluation of diarrhea.  Will obtain repeat labs here, provide IV fluids, oral potassium, and obtain CT abdomen pelvis.  Labs here redemonstrate AKI with a creatinine of 1.67.  Unfortunately, patient also has worsening hypokalemia with a K down to 2.8.  Stable anemia.  CT with possible area of colitis.  Vital signs reassuring, no evidence of sepsis.  Will cover with antibiotics for possible bacterial component.  Does have anaphylactic penicillin allergy, so will order Levaquin and Flagyl.  With worsening hypokalemia and ongoing diarrhea as well as AKI, I do think admission is reasonable for further monitoring.  Will reach out to hospitalist team.  Case discussed with hospitalist team.  They will evaluate the patient for anticipated admission.      FINAL CLINICAL IMPRESSION(S) / ED DIAGNOSES   Final diagnoses:  Diarrhea of presumed infectious origin  Dehydration  Hypokalemia  Acute kidney injury (HCC)     Rx / DC Orders   ED Discharge Orders     None        Note:  This document was prepared using Dragon voice recognition software and may include unintentional dictation errors.   Trinna Post, MD 06/19/22 [phone]

## 2022-06-18 NOTE — ED Triage Notes (Addendum)
Pt presents to ED from [facility] clinic with c/o of having lower back pain and pt states she was sent from [facility] clinic for IV fluids and possible admission. Pt is A&Ox4.   Labs drawn at [facility] clinic and UA resulted as well.

## 2022-06-18 NOTE — ED Notes (Signed)
Likely admit

## 2022-06-19 LAB — BASIC METABOLIC PANEL
Anion gap: 8 (ref 5–15)
BUN: 31 mg/dL — ABNORMAL HIGH (ref 8–23)
CO2: 22 mmol/L (ref 22–32)
Calcium: 8.4 mg/dL — ABNORMAL LOW (ref 8.9–10.3)
Chloride: 106 mmol/L (ref 98–111)
Creatinine, Ser: 1.4 mg/dL — ABNORMAL HIGH (ref 0.44–1.00)
GFR, Estimated: 35 mL/min — ABNORMAL LOW (ref >60)
Glucose, Bld: 94 mg/dL (ref 70–99)
Potassium: 3.6 mmol/L (ref 3.5–5.1)
Sodium: 136 mmol/L (ref 135–145)

## 2022-06-19 LAB — GASTROINTESTINAL PANEL BY PCR, STOOL (REPLACES STOOL CULTURE)

## 2022-06-19 LAB — CBC
HCT: 35 % — ABNORMAL LOW (ref 36.0–46.0)
Hemoglobin: 11.5 g/dL — ABNORMAL LOW (ref 12.0–15.0)
MCH: 31.3 pg (ref 26.0–34.0)
MCHC: 32.9 g/dL (ref 30.0–36.0)
MCV: 95.1 fL (ref 80.0–100.0)
Platelets: 168 10*3/uL (ref 150–400)
RBC: 3.68 MIL/uL — ABNORMAL LOW (ref 3.87–5.11)
RDW: 14.5 % (ref 11.5–15.5)
WBC: 7 10*3/uL (ref 4.0–10.5)
nRBC: 0 % (ref 0.0–0.2)

## 2022-06-19 LAB — C DIFFICILE QUICK SCREEN W PCR REFLEX
C Diff antigen: NEGATIVE
C Diff interpretation: NOT DETECTED
C Diff toxin: NEGATIVE

## 2022-06-19 LAB — HEMOGLOBIN A1C
Hgb A1c MFr Bld: 5.1 % (ref 4.8–5.6)
Mean Plasma Glucose: 99.67 mg/dL

## 2022-06-19 LAB — CBG MONITORING, ED
Glucose-Capillary: 89 mg/dL (ref 70–99)
Glucose-Capillary: 107 mg/dL — ABNORMAL HIGH (ref 70–99)

## 2022-06-19 LAB — GLUCOSE, CAPILLARY
Glucose-Capillary: 77 mg/dL (ref 70–99)
Glucose-Capillary: 110 mg/dL — ABNORMAL HIGH (ref 70–99)

## 2022-06-19 MED ORDER — PROSIGHT PO TABS
1.0000 | ORAL_TABLET | Freq: Two times a day (BID) | ORAL | Status: DC
  Administered 2022-06-19 – 2022-06-20 (×4): 1 via ORAL
  Filled 2022-06-19 (×4): qty 1

## 2022-06-19 MED ORDER — VITAMIN D 25 MCG (1000 UNIT) PO TABS
1000.0000 [IU] | ORAL_TABLET | Freq: Every evening | ORAL | Status: DC
  Administered 2022-06-19: 1000 [IU] via ORAL
  Filled 2022-06-19: qty 1

## 2022-06-19 MED ORDER — ENOXAPARIN SODIUM 30 MG/0.3ML IJ SOSY
30.0000 mg | PREFILLED_SYRINGE | INTRAMUSCULAR | Status: DC
  Administered 2022-06-19: 30 mg via SUBCUTANEOUS
  Filled 2022-06-19: qty 0.3

## 2022-06-19 MED ORDER — ONDANSETRON HCL 4 MG PO TABS: 4.0000 mg | ORAL_TABLET | Freq: Four times a day (QID) | ORAL | Status: DC | PRN

## 2022-06-19 MED ORDER — HYDRALAZINE HCL 10 MG PO TABS: 10.0000 mg | ORAL_TABLET | Freq: Four times a day (QID) | ORAL | Status: DC | PRN

## 2022-06-19 MED ORDER — CIPROFLOXACIN IN D5W 200 MG/100ML IV SOLN
200.0000 mg | INTRAVENOUS | Status: DC
  Administered 2022-06-20: 200 mg via INTRAVENOUS
  Filled 2022-06-19: qty 100

## 2022-06-19 MED ORDER — POLYVINYL ALCOHOL 1.4 % OP SOLN: 1.0000 [drp] | Freq: Every day | OPHTHALMIC | Status: DC

## 2022-06-19 MED ORDER — ACETAMINOPHEN 325 MG PO TABS: 650.0000 mg | ORAL_TABLET | Freq: Four times a day (QID) | ORAL | Status: DC | PRN

## 2022-06-19 MED ORDER — LACTATED RINGERS IV SOLN: INTRAVENOUS | Status: AC

## 2022-06-19 MED ORDER — POTASSIUM CHLORIDE 20 MEQ PO PACK
40.0000 meq | PACK | Freq: Once | ORAL | Status: AC
  Administered 2022-06-19: 40 meq via ORAL
  Filled 2022-06-19: qty 2

## 2022-06-19 MED ORDER — OXYCODONE HCL 5 MG PO TABS: 5.0000 mg | ORAL_TABLET | ORAL | Status: DC | PRN

## 2022-06-19 MED ORDER — METRONIDAZOLE 500 MG/100ML IV SOLN
500.0000 mg | Freq: Once | INTRAVENOUS | Status: AC
  Administered 2022-06-19: 500 mg via INTRAVENOUS
  Filled 2022-06-19: qty 100

## 2022-06-19 MED ORDER — ONDANSETRON HCL 4 MG/2ML IJ SOLN: 4.0000 mg | Freq: Four times a day (QID) | INTRAMUSCULAR | Status: DC | PRN

## 2022-06-19 MED ORDER — ACETAMINOPHEN 650 MG RE SUPP: 650.0000 mg | Freq: Four times a day (QID) | RECTAL | Status: DC | PRN

## 2022-06-19 MED ORDER — METOPROLOL TARTRATE 25 MG PO TABS
25.0000 mg | ORAL_TABLET | Freq: Two times a day (BID) | ORAL | Status: DC
  Administered 2022-06-19 – 2022-06-20 (×4): 25 mg via ORAL
  Filled 2022-06-19 (×4): qty 1

## 2022-06-19 MED ORDER — INSULIN ASPART 100 UNIT/ML IJ SOLN
0.0000 [IU] | Freq: Three times a day (TID) | INTRAMUSCULAR | Status: DC
  Administered 2022-06-20: 1 [IU] via SUBCUTANEOUS
  Filled 2022-06-19: qty 1

## 2022-06-19 MED ORDER — SODIUM CHLORIDE 0.9% FLUSH
3.0000 mL | Freq: Two times a day (BID) | INTRAVENOUS | Status: DC
  Administered 2022-06-19 – 2022-06-20 (×4): 3 mL via INTRAVENOUS

## 2022-06-19 MED ORDER — HEPARIN SODIUM (PORCINE) 5000 UNIT/ML IJ SOLN
5000.0000 [IU] | Freq: Three times a day (TID) | INTRAMUSCULAR | Status: DC
  Administered 2022-06-19 – 2022-06-20 (×3): 5000 [IU] via SUBCUTANEOUS
  Filled 2022-06-19 (×4): qty 1

## 2022-06-19 NOTE — ED Notes (Signed)
Assumed care from Amanda,RN. Pt resting comfortably in bed at this time. Pt denies any current needs or questions. Call light with in reach.   

## 2022-06-19 NOTE — Progress Notes (Signed)
PROGRESS NOTE    Brandi Reid   WUJ:[id number] DOB: May 31, 1927  DOA: 06/18/2022 Date of Service: 06/19/22 PCP: Myrene Buddy, NP     Brief Narrative / Hospital Course:  Brandi Reid is a 89 y.o. female with medical history significant of chronic anemia, breast cancer, h/o CHF with EF of 55%, GERD, HTN, OA, spinal stenosis, h/o uterine cancer who presents for a week of diarrhea, urinary symptoms and fatigue.  She notes up to 10 bowel movements per day, at times mucus like, no blood, gassy and uncomfortable abdomen.  05/20: In the ED, she had a CT of the colon which showed mild colitis. She had a mild AKI on CKD 3a, K was 3.8. Initially thought to have UTI, however, review of UA from [facility] clinic today showed a bland urine without any white cells. She was initiated on antibiotics for colitis with levaquin and flagyl, admitted to hospitalist, abx held given mild symptoms  05/21: continued diarrhea, restarted abx w/ cipro/flagyl     Consultants:  none  Procedures: none      ASSESSMENT & PLAN:   Principal Problem:   AKI (acute kidney injury) (HCC) Active Problems:   CHF (congestive heart failure) (HCC)   HTN (hypertension)   Spinal stenosis  AKI (acute kidney injury) - improving Likely related to diarrhea and poor PO intake S/p IV fluids  D/c IV fluids d/t hc CHF Hold advil, jardiance, losartan Tylenol for pain or fever Monitor BMP  Acute colitis, mild Cipro/Flagyl GI panel pending incl PCR and CDiff   Hypokalemia Check magnesium - WNL Replaced IV and oral potassium Monitor BMP   HTN (hypertension) Continue home metoprolol Hold losartan Hydralazine PRN for elevated blood pressues    CHF (congestive heart failure) with preserved EF Hold ARB and jardiance in the setting of AKI Monitor for volume overload    Spinal stenosis Tylenol and oxycodone for pain if needed    DVT prophylaxis: heparin Pertinent IV  fluids/nutrition: no conitnuous fluids at this time  Central lines / invasive devices: none  Code Status: DNR ACP documentation reviewed: 06/19/22 has DNR Order on file dated 03/26/22  Current Admission Status: inpatient   TOC needs / Dispo plan: pending clinical course, PT/OT recs  Barriers to discharge / significant pending items: resolution diarrhea, toelrate po , await stool studies         Subjective / Brief ROS:  Patient reports abdominal discomfort overall better SHe reports significant fatigue Denies CP/SOB.  Pain controlled.  Denies new weakness.  Tolerating diet but low appetite.  Reports no concerns w/ urination/defecation.   Family Communication: spoke on phone w/ daughter Lorelle Formosa 06/19/22 3:44 PM     Objective Findings:  Vitals:   06/19/22 1000 06/19/22 1200 06/19/22 1230 06/19/22 1330  BP: 136/67 116/74 138/65 136/66  Pulse: 77 69 71 70  Resp: 16   16  Temp: 98.4 F (36.9 C)   98.2 F (36.8 C)  TempSrc:      SpO2: 99% 97% 99% 99%  Weight:      Height:       No intake or output data in the 24 hours ending 06/19/22 1544 Filed Weights   06/18/22 2100 06/19/22 0749  Weight: 50.7 kg 50.7 kg    Examination:  Physical Exam Constitutional:      General: She is not in acute distress. HENT:     Mouth/Throat:     Mouth: Mucous membranes are dry.  Cardiovascular:     Rate  and Rhythm: Normal rate and regular rhythm.     Heart sounds: Murmur heard.  Pulmonary:     Effort: Pulmonary effort is normal. No respiratory distress.     Breath sounds: Normal breath sounds.  Abdominal:     General: Abdomen is flat. Bowel sounds are normal.     Palpations: Abdomen is soft.  Musculoskeletal:     Right lower leg: No edema.     Left lower leg: No edema.  Skin:    General: Skin is warm and dry.  Neurological:     General: No focal deficit present.     Mental Status: She is alert and oriented to person, place, and time. Mental status is at baseline.   Psychiatric:        Mood and Affect: Mood normal.        Behavior: Behavior normal.          Scheduled Medications:   cholecalciferol  1,000 Units Oral QPM   heparin injection (subcutaneous)  5,000 Units Subcutaneous Q8H   insulin aspart  0-9 Units Subcutaneous TID WC   metoprolol tartrate  25 mg Oral BID   multivitamin  1 tablet Oral BID   [START ON 06/20/2022] polyvinyl alcohol  1 drop Both Eyes Daily   sodium chloride flush  3 mL Intravenous Q12H    Continuous Infusions:  [START ON 06/20/2022] ciprofloxacin      PRN Medications:  acetaminophen **OR** acetaminophen, hydrALAZINE, ondansetron **OR** ondansetron (ZOFRAN) IV, oxyCODONE  Antimicrobials from admission:  Anti-infectives (From admission, onward)    Start     Dose/Rate Route Frequency Ordered Stop   06/20/22 0800  ciprofloxacin (CIPRO) IVPB 200 mg        200 mg 100 mL/hr over 60 Minutes Intravenous Every 24 hours 06/19/22 1300     06/19/22 1315  metroNIDAZOLE (FLAGYL) IVPB 500 mg        500 mg 100 mL/hr over 60 Minutes Intravenous  Once 06/19/22 1300 06/19/22 1508   06/18/22 2130  levofloxacin (LEVAQUIN) IVPB 500 mg        500 mg 100 mL/hr over 60 Minutes Intravenous  Once 06/18/22 2121 06/19/22 0030   06/18/22 2115  levofloxacin (LEVAQUIN) IVPB 750 mg  Status:  Discontinued        750 mg 100 mL/hr over 90 Minutes Intravenous  Once 06/18/22 2106 06/18/22 2121   06/18/22 2115  metroNIDAZOLE (FLAGYL) IVPB 500 mg        500 mg 100 mL/hr over 60 Minutes Intravenous  Once 06/18/22 2106 06/19/22 0030           Data Reviewed:  I have personally reviewed the following...  CBC: Recent Labs  Lab 06/18/22 1907 06/19/22 0124  WBC 3.9* 7.0  NEUTROABS 1.2*  --   HGB 10.8* 11.5*  HCT 33.1* 35.0*  MCV 94.8 95.1  PLT 150 168   Basic Metabolic Panel: Recent Labs  Lab 06/18/22 1907 06/19/22 0124  NA 136 136  K 2.8* 3.6  CL 103 106  CO2 25 22  GLUCOSE 143* 94  BUN 35* 31*  CREATININE 1.67* 1.40*   CALCIUM 8.8* 8.4*  MG 2.0  --    GFR: Estimated Creatinine Clearance: 20.5 mL/min (A) (by C-G formula based on SCr of 1.4 mg/dL (H)). Liver Function Tests: Recent Labs  Lab 06/18/22 1907  AST 17  ALT 8  ALKPHOS 32*  BILITOT 0.8  PROT 6.2*  ALBUMIN 3.8   Recent Labs  Lab 06/18/22 1907  LIPASE  28   No results for input(s): "AMMONIA" in the last 168 hours. Coagulation Profile: No results for input(s): "INR", "PROTIME" in the last 168 hours. Cardiac Enzymes: No results for input(s): "CKTOTAL", "CKMB", "CKMBINDEX", "TROPONINI" in the last 168 hours. BNP (last 3 results) No results for input(s): "PROBNP" in the last 8760 hours. HbA1C: Recent Labs    06/19/22 0124  HGBA1C 5.1   CBG: Recent Labs  Lab 06/19/22 0746 06/19/22 1124  GLUCAP 89 107*   Lipid Profile: No results for input(s): "CHOL", "HDL", "LDLCALC", "TRIG", "CHOLHDL", "LDLDIRECT" in the last 72 hours. Thyroid Function Tests: No results for input(s): "TSH", "T4TOTAL", "FREET4", "T3FREE", "THYROIDAB" in the last 72 hours. Anemia Panel: No results for input(s): "VITAMINB12", "FOLATE", "FERRITIN", "TIBC", "IRON", "RETICCTPCT" in the last 72 hours. Most Recent Urinalysis On File:     Component Value Date/Time   COLORURINE Yellow 02/16/2013 2125   APPEARANCEUR Clear 02/16/2013 2125   LABSPEC 1.018 02/16/2013 2125   PHURINE 6.0 02/16/2013 2125   GLUCOSEU Negative 02/16/2013 2125   HGBUR Negative 02/16/2013 2125   BILIRUBINUR Negative 02/16/2013 2125   KETONESUR Trace 02/16/2013 2125   PROTEINUR 30 mg/dL 16/10/9602 5409   NITRITE Negative 02/16/2013 2125   LEUKOCYTESUR 3+ 02/16/2013 2125   Sepsis Labs: @LABRCNTIP (procalcitonin:4,lacticidven:4) Microbiology: Recent Results (from the past 240 hour(s))  C Difficile Quick Screen w PCR reflex     Status: None   Collection Time: 06/19/22  2:08 PM   Specimen: Stool  Result Value Ref Range Status   C Diff antigen NEGATIVE NEGATIVE Final   C Diff toxin  NEGATIVE NEGATIVE Final   C Diff interpretation No C. difficile detected.  Final    Comment: Performed at [facility], [address] [place], [place] [address]      Radiology Studies last 3 days: CT ABDOMEN PELVIS WO CONTRAST  Result Date: 06/18/2022 CLINICAL DATA:  Abdomen pain bloating and diarrhea EXAM: CT ABDOMEN AND PELVIS WITHOUT CONTRAST TECHNIQUE: Multidetector CT imaging of the abdomen and pelvis was performed following the standard protocol without IV contrast. RADIATION DOSE REDUCTION: This exam was performed according to the departmental dose-optimization program which includes automated exposure control, adjustment of the mA and/or kV according to patient size and/or use of iterative reconstruction technique. COMPARISON:  CT chest 03/25/2022 FINDINGS: Lower chest: Lung bases demonstrate no acute airspace disease. Mild reticular densities at the right base likely due to scarring. Cardiomegaly. Hepatobiliary: Cholecystectomy. No focal hepatic abnormality. Prominent common bile duct measuring up to 12 mm likely due to postsurgical change. Pancreas: Unremarkable. No pancreatic ductal dilatation or surrounding inflammatory changes. Spleen: Borderline enlarged with craniocaudal measurement of 12 cm Adrenals/Urinary Tract: Adrenal glands are normal. Kidneys show no hydronephrosis. The bladder is unremarkable Stomach/Bowel: The stomach is nonenlarged. No dilated small bowel. Mild diffuse fluid distension of the colon corresponding to history of diarrhea. Suspicion of mild diffuse colon wall thickening as may be seen with colitis. Vascular/Lymphatic: Advanced aortic atherosclerosis. No aneurysm. No suspicious lymph nodes. Reproductive: Status post hysterectomy. No adnexal masses. Other: Negative for pelvic effusion or free air. Musculoskeletal: Scoliosis and degenerative changes of the spine. IMPRESSION: 1. Mild diffuse fluid distension of the colon corresponding to history of diarrhea.  Suspicion of mild diffuse colon wall thickening as may be seen with colitis. 2. Aortic atherosclerosis. Aortic Atherosclerosis (ICD10-I70.0). Electronically Signed   By: Jasmine Pang M.D.   On: 06/18/2022 19:45             LOS: 1 day  Sunnie Nielsen, DO [facility] 06/19/2022, 3:44 PM    Dictation software may have been used to generate the above note. Typos may occur and escape review in typed/dictated notes. Please contact Dr Lyn Hollingshead directly for clarity if needed.  Staff may message me via secure chat in Epic  but this may not receive an immediate response,  please page me for urgent matters!  If 7PM-7AM, please contact night coverage [email or web address]

## 2022-06-19 NOTE — Progress Notes (Addendum)
PHARMACY NOTE:  ANTIMICROBIAL RENAL DOSAGE ADJUSTMENT  Current antimicrobial regimen includes a mismatch between antimicrobial dosage and estimated renal function.  As per policy approved by the Pharmacy & Therapeutics and Medical Executive Committees, the antimicrobial dosage will be adjusted accordingly.  Current antimicrobial dosage:  ciprofloxacin IV 200 mg every 12 hours  Indication: acute colitis  Renal Function:  Estimated Creatinine Clearance: 20.5 mL/min (A) (by C-G formula based on SCr of 1.4 mg/dL (H)). []      On intermittent HD, scheduled: []      On CRRT     Antimicrobial dosage has been changed to:  ciprofloxacin IV 200 mg every 24 hours (adjusted start time of ciprofloxacin to allow for clearance of levofloxacin given current CrCl of ~20 mL/min) Additional comments:  Elliot Gurney, PharmD, BCPS Clinical Pharmacist  06/19/2022 1:05 PM

## 2022-06-19 NOTE — Hospital Course (Addendum)
Brandi Reid is a 89 y.o. female with medical history significant of chronic anemia, breast cancer, h/o CHF with EF of 55%, GERD, HTN, OA, spinal stenosis, h/o uterine cancer who presents for a week of diarrhea, urinary symptoms and fatigue.  She notes up to 10 bowel movements per day, at times mucus like, no blood, gassy and uncomfortable abdomen.  05/20: In the ED, she had a CT of the colon which showed mild colitis. She had a mild AKI on CKD 3a, K was 3.8. Initially thought to have UTI, however, review of UA from [facility] clinic today showed a bland urine without any white cells. She was initiated on antibiotics for colitis with levaquin and flagyl, admitted to hospitalist, abx held given mild symptoms  05/21: continued diarrhea, restarted abx w/ cipro/flagyl     Consultants:  none  Procedures: none      ASSESSMENT & PLAN:   Principal Problem:   AKI (acute kidney injury) (HCC) Active Problems:   CHF (congestive heart failure) (HCC)   HTN (hypertension)   Spinal stenosis  AKI (acute kidney injury) - improving Likely related to diarrhea and poor PO intake S/p IV fluids  D/c IV fluids d/t hc CHF Hold advil, jardiance, losartan Tylenol for pain or fever Monitor BMP  Acute colitis, mild Cipro/Flagyl GI panel pending incl PCR and CDiff   Hypokalemia Check magnesium - WNL Replaced IV and oral potassium Monitor BMP   HTN (hypertension) Continue home metoprolol Hold losartan Hydralazine PRN for elevated blood pressues    CHF (congestive heart failure) with preserved EF Hold ARB and jardiance in the setting of AKI Monitor for volume overload    Spinal stenosis Tylenol and oxycodone for pain if needed    DVT prophylaxis: heparin Pertinent IV fluids/nutrition: no conitnuous fluids at this time  Central lines / invasive devices: none  Code Status: DNR ACP documentation reviewed: 06/19/22 has DNR Order on file dated 03/26/22  Current Admission Status:  inpatient   TOC needs / Dispo plan: pending clinical course, PT/OT recs  Barriers to discharge / significant pending items: resolution diarrhea, toelrate po , await stool studies

## 2022-06-20 DIAGNOSIS — N179 Acute kidney failure, unspecified: Secondary | ICD-10-CM | POA: Diagnosis not present

## 2022-06-20 LAB — BASIC METABOLIC PANEL
Anion gap: 6 (ref 5–15)
BUN: 27 mg/dL — ABNORMAL HIGH (ref 8–23)
CO2: 21 mmol/L — ABNORMAL LOW (ref 22–32)
Calcium: 8.5 mg/dL — ABNORMAL LOW (ref 8.9–10.3)
Chloride: 108 mmol/L (ref 98–111)
Creatinine, Ser: 1.29 mg/dL — ABNORMAL HIGH (ref 0.44–1.00)
GFR, Estimated: 39 mL/min — ABNORMAL LOW (ref >60)
Glucose, Bld: 88 mg/dL (ref 70–99)
Potassium: 4 mmol/L (ref 3.5–5.1)
Sodium: 135 mmol/L (ref 135–145)

## 2022-06-20 LAB — GLUCOSE, CAPILLARY
Glucose-Capillary: 85 mg/dL (ref 70–99)
Glucose-Capillary: 123 mg/dL — ABNORMAL HIGH (ref 70–99)

## 2022-06-20 MED ORDER — CIPROFLOXACIN HCL 500 MG PO TABS: 500.0000 mg | ORAL_TABLET | Freq: Two times a day (BID) | ORAL | 0 refills | Status: AC

## 2022-06-20 MED ORDER — ACETAMINOPHEN 325 MG PO TABS: 650.0000 mg | ORAL_TABLET | Freq: Four times a day (QID) | ORAL | 0 refills | Status: AC | PRN

## 2022-06-20 MED ORDER — METRONIDAZOLE 500 MG PO TABS: 500.0000 mg | ORAL_TABLET | Freq: Three times a day (TID) | ORAL | 0 refills | Status: AC

## 2022-06-20 MED ORDER — SIMETHICONE 80 MG PO CHEW
80.0000 mg | CHEWABLE_TABLET | Freq: Once | ORAL | Status: AC
  Administered 2022-06-20: 80 mg via ORAL
  Filled 2022-06-20: qty 1

## 2022-06-20 MED ORDER — HYDRALAZINE HCL 25 MG PO TABS: 50.0000 mg | ORAL_TABLET | Freq: Three times a day (TID) | ORAL | 0 refills | Status: AC

## 2022-06-20 NOTE — Evaluation (Signed)
Physical Therapy Evaluation Patient Details Name: Enez Harjo MRN: [id number] DOB: 22-Jul-1927 Today's Date: 06/20/2022  History of Present Illness  Pt admitted for AKI with complaints of diarrhea and urinary symptoms. History includes anemia, breast ca, CHF, GERD, HTN, OA, and uterine ca.  Clinical Impression  Pt is a pleasant 89 year old female who was admitted for AKI. Pt performs transfers with mod I and ambulation with supervision and IV pole. Pt demonstrates deficits with balance/mobility. Would benefit from skilled PT to address above deficits and promote optimal return to PLOF. Pt will continue to receive skilled PT services while admitted and will defer to TOC/care team for updates regarding disposition planning. Will maintain on caseload throughout admission to maintain mobility and decrease muscle atrophy.      Recommendations for follow up therapy are one component of a multi-disciplinary discharge planning process, led by the attending physician.  Recommendations may be updated based on patient status, additional functional criteria and insurance authorization.  Follow Up Recommendations       Assistance Recommended at Discharge PRN  Patient can return home with the following       Equipment Recommendations None recommended by PT  Recommendations for Other Services       Functional Status Assessment Patient has had a recent decline in their functional status and demonstrates the ability to make significant improvements in function in a reasonable and predictable amount of time.     Precautions / Restrictions Precautions Precautions: Fall Restrictions Weight Bearing Restrictions: No      Mobility  Bed Mobility               General bed mobility comments: NT, received in recliner    Transfers Overall transfer level: Modified independent Equipment used: Rolling walker (2 wheels)               General transfer comment: safe technique  and follows commands well.    Ambulation/Gait Ambulation/Gait assistance: Supervision Gait Distance (Feet): 200 Feet Assistive device: IV Pole Gait Pattern/deviations: Step-through pattern       General Gait Details: ambulated with slow gait speed and reciprocal gait pattern. Would benefit from further mobility efforts using formalized AD. Pt currently declines RW this session  Stairs            Wheelchair Mobility    Modified Rankin (Stroke Patients Only)       Balance Overall balance assessment: Mild deficits observed, not formally tested                                           Pertinent Vitals/Pain Pain Assessment Pain Assessment: No/denies pain    Home Living Family/patient expects to be discharged to:: Private residence Living Arrangements: Alone Available Help at Discharge: Family;Available PRN/intermittently Type of Home: House Home Access: Stairs to enter Entrance Stairs-Rails: None (uses back of "sturdy" chair to hold on) Entrance Stairs-Number of Steps: 1   Home Layout: One level Home Equipment: Rolling Walker (2 wheels);Grab bars - tub/shower;Cane - quad      Prior Function Prior Level of Function : Driving;Independent/Modified Independent;Working/employed             Mobility Comments: hasn't been using AD, although does admit to furniture walking. 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)     Hand Dominance        Extremity/Trunk Assessment   Upper Extremity Assessment Upper Extremity Assessment: Overall WFL for tasks assessed    Lower Extremity Assessment Lower Extremity Assessment: Overall WFL for tasks assessed       Communication   Communication: HOH  Cognition Arousal/Alertness: Awake/alert Behavior During Therapy: WFL for tasks  assessed/performed Overall Cognitive Status: Within Functional Limits for tasks assessed                                 General Comments: HOH        General Comments      Exercises     Assessment/Plan    PT Assessment Patient needs continued PT services  PT Problem List Decreased balance       PT Treatment Interventions Gait training;Balance training    PT Goals (Current goals can be found in the Care Plan section)  Acute Rehab PT Goals Patient Stated Goal: to go home PT Goal Formulation: With patient Time For Goal Achievement: 07/04/22 Potential to Achieve Goals: Good    Frequency Min 2X/week     Co-evaluation               AM-PAC PT "6 Clicks" Mobility  Outcome Measure Help needed turning from your back to your side while in a flat bed without using bedrails?: None Help needed moving from lying on your back to sitting on the side of a flat bed without using bedrails?: None Help needed moving to and from a bed to a chair (including a wheelchair)?: None Help needed standing up from a chair using your arms (e.g., wheelchair or bedside chair)?: None Help needed to walk in hospital room?: A Little Help needed climbing 3-5 steps with a railing? : A Little 6 Click Score: 22    End of Session   Activity Tolerance: Patient tolerated treatment well Patient left: in chair;with chair alarm set Nurse Communication: Mobility status PT Visit Diagnosis: Unsteadiness on feet (R26.81)    Time: 1610-9604 PT Time Calculation (min) (ACUTE ONLY): 15 min   Charges:   PT Evaluation $PT Eval Low Complexity: 1 Low          Elizabeth Palau, PT, DPT, GCS [phone]   Arkie Tagliaferro 06/20/2022, 11:17 AM

## 2022-06-20 NOTE — Plan of Care (Signed)

## 2022-06-20 NOTE — Discharge Summary (Signed)
Physician Discharge Summary   Patient: Brandi Reid MRN: [id number] DOB: 27-Feb-1927  Admit date:     06/18/2022  Discharge date: 06/20/22  Discharge Physician: Loyce Dys   PCP: Myrene Buddy, NP   Recommendations at discharge:  We have held some of your blood pressure medications because of acute kidney injury. You will follow-up with your primary care physician and this may be resumed at a later date Do not take Advil as this will have negative impact on your kidneys  Discharge Diagnoses: AKI (acute kidney injury) - improving Acute colitis, mild Hypokalemia HTN (hypertension) CHF (congestive heart failure) with preserved EF Spinal stenosis   Hospital Course: Brandi Reid is a 89 y.o. female with medical history significant of chronic anemia, breast cancer, h/o CHF with EF of 55%, GERD, HTN, OA, spinal stenosis, h/o uterine cancer who presents for a week of diarrhea, urinary symptoms and fatigue.  She notes up to 10 bowel movements per day, at times mucus like, no blood, gassy and uncomfortable abdomen.  05/20: In the ED, she had a CT of the colon which showed mild colitis. She had a mild AKI on CKD 3a, K was 3.8. Initially thought to have UTI, however, review of UA from [facility] clinic today showed a bland urine without any white cells. She was initiated on antibiotics for colitis with levaquin and flagyl, admitted to hospitalist.  Stool studies were negative diarrhea have improved and patient is therefore being discharged today to complete a course of antibiotics and to follow-up with primary care physician.   Consultants: None Procedures performed: None Disposition: Home Diet recommendation:  Discharge Diet Orders (From admission, onward)     Start     Ordered   06/20/22 0000  Diet - low sodium heart healthy        06/20/22 1316           Cardiac diet DISCHARGE MEDICATION: Allergies as of 06/20/2022       Reactions   Compazine  [prochlorperazine] Anaphylaxis   Penicillins Anaphylaxis   Did it involve swelling of the face/tongue/throat, SOB, or low BP? Yes Did it involve sudden or severe rash/hives, skin peeling, or any reaction on the inside of your mouth or nose? No Did you need to seek medical attention at a hospital or doctor's office? Yes When did it last happen? More than 30-40 years ago If all above answers are "NO", may proceed with cephalosporin use.        Medication List     STOP taking these medications    Advil Dual Action 125-250 MG Tabs Generic drug: Ibuprofen-Acetaminophen   furosemide 20 MG tablet Commonly known as: Lasix   hydrochlorothiazide 12.5 MG tablet Commonly known as: HYDRODIURIL   losartan 25 MG tablet Commonly known as: Cozaar       TAKE these medications    acetaminophen 325 MG tablet Commonly known as: TYLENOL Take 2 tablets (650 mg total) by mouth every 6 (six) hours as needed for mild pain (or Fever >/= 101).   cholecalciferol 25 MCG (1000 UNIT) tablet Commonly known as: VITAMIN D3 Take 1,000 Units by mouth every evening.   ciprofloxacin 500 MG tablet Commonly known as: Cipro Take 1 tablet (500 mg total) by mouth 2 (two) times daily for 5 days.   empagliflozin 10 MG Tabs tablet Commonly known as: Jardiance Take 1 tablet (10 mg total) by mouth daily before breakfast.   hydrALAZINE 25 MG tablet Commonly known as: APRESOLINE Take 2  tablets (50 mg total) by mouth 3 (three) times daily.   Lubricant Eye Drops 0.4-0.3 % Soln Generic drug: Polyethyl Glycol-Propyl Glycol Place 1 drop into both eyes daily.   metoprolol tartrate 25 MG tablet Commonly known as: LOPRESSOR Take 25 mg by mouth 2 (two) times daily.   metroNIDAZOLE 500 MG tablet Commonly known as: Flagyl Take 1 tablet (500 mg total) by mouth 3 (three) times daily for 5 days.   PRESERVISION AREDS 2 PO Take 1 tablet by mouth 2 (two) times daily.   Senna-Time 8.6 MG tablet Generic drug:  senna Take 1 tablet by mouth daily as needed.        Discharge Exam: Filed Weights   06/18/22 2100 06/19/22 0749  Weight: 50.7 kg 50.7 kg   Constitutional:      General: She is not in acute distress. HENT:     Mouth/Throat:     Mouth: Mucous membranes are moist Cardiovascular:     Rate and Rhythm: Normal rate and regular rhythm.  Pulmonary:     Effort: Pulmonary effort is normal. No respiratory distress.  Abdominal:     General: Abdomen is flat. Bowel sounds are normal.  Musculoskeletal:     Right lower leg: No edema.     Left lower leg: No edema.  Skin:    General: Skin is warm and dry.  Neurological:     General: No focal deficit present.     Mental Status: She is alert and oriented to person, place, and time. Mental status is at baseline.  Psychiatric:        Mood and Affect: Mood normal.        Behavior: Behavior normal.   Condition at discharge: goodDischarge time spent: greater than 30 minutes.  Signed: Loyce Dys, MD [facility] 06/20/2022

## 2022-06-20 NOTE — Evaluation (Signed)
Occupational Therapy Evaluation Patient Details Name: Brandi Reid MRN: [id number] DOB: 17-Sep-1927 Today's Date: 06/20/2022   History of Present Illness Pt admitted for AKI with complaints of diarrhea and urinary symptoms. History includes anemia, breast ca, CHF, GERD, HTN, OA, and uterine ca.   Clinical Impression   Patient agreeable to OT evaluation. 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 Pt currently functioning at Mod I for bed mobility, supervision to stand from EOB, CGA to stand from toilet 2/2 lower transfer surface (has handicapped toilet at home), and CGA-supervision for functional mobility to the bathroom without an AD (intermittently furniture walking). Pt grossly set up-supervision for self-care tasks. Pt appears close to baseline level of function with ADLs. Will keep on OT caseload to continue ADL training in order to prevent functional decline and reduce caregiver burden. No follow up OT needs recommended at this time.   Recommendations for follow up therapy are one component of a multi-disciplinary discharge planning process, led by the attending physician.  Recommendations may be updated based on patient status, additional functional criteria and insurance authorization.   Assistance Recommended at Discharge PRN  Patient can return home with the following Assistance with cooking/housework;Assist for transportation;Help with stairs or ramp for entrance    Functional Status Assessment  Patient has had a recent decline in their functional status and demonstrates the ability to make significant improvements in function in a reasonable and predictable amount of time.  Equipment Recommendations  None recommended by OT    Recommendations for Other Services       Precautions / Restrictions Precautions Precautions: Fall Restrictions Weight Bearing Restrictions: No      Mobility Bed Mobility Overal bed  mobility: Modified Independent                  Transfers Overall transfer level: Needs assistance Equipment used: None Transfers: Sit to/from Stand Sit to Stand: Supervision (from EOB)           General transfer comment: pt deferred use of RW (reports not using one at home)      Balance Overall balance assessment: Mild deficits observed, not formally tested         ADL either performed or assessed with clinical judgement   ADL Overall ADL's : Needs assistance/impaired     Grooming: Supervision/safety;Standing;Wash/dry hands               Lower Body Dressing: Set up;Supervision/safety;Sitting/lateral leans;Sit to/from stand Lower Body Dressing Details (indicate cue type and reason): to don underwear  Toilet Transfer: Min guard;Regular Toilet;Grab bars Toilet Transfer Details (indicate cue type and reason): to stand from lower transfer surface, required use of grab bar Toileting- Clothing Manipulation and Hygiene: Supervision/safety;Sit to/from stand Toileting - Clothing Manipulation Details (indicate cue type and reason): for peri care and clothing management after continent void in toilet     Functional mobility during ADLs: Supervision/safety;Min guard (~29ft to the bathroom, CGA for safety 2/2 not using AD and pt intermittently reaching out for furniture)       Vision Baseline Vision/History: 1 Wears glasses Patient Visual Report: No change from baseline       Perception     Praxis      Pertinent Vitals/Pain Pain Assessment Pain Assessment: No/denies pain     Hand Dominance     Extremity/Trunk Assessment Upper Extremity Assessment Upper Extremity Assessment: Overall WFL for tasks assessed   Lower Extremity Assessment Lower Extremity Assessment:  Overall WFL for tasks assessed       Communication Communication Communication: HOH   Cognition Arousal/Alertness: Awake/alert Behavior During Therapy: WFL for tasks  assessed/performed Overall Cognitive Status: Within Functional Limits for tasks assessed         General Comments: HOH     General Comments       Exercises Other Exercises Other Exercises: OT provided education re: role of OT, OT POC, post acute recs, sitting up for all meals, EOB/OOB mobility with assistance, home/fall safety.     Shoulder Instructions      Home Living Family/patient expects to be discharged to:: Private residence Living Arrangements: Alone Available Help at Discharge: Family;Available PRN/intermittently Type of Home: House Home Access: Stairs to enter Entrance Stairs-Number of Steps: 1 Entrance Stairs-Rails: None (uses back of "sturdy" chair to hold on) Home Layout: One level     Bathroom Shower/Tub: Producer, television/film/video: Handicapped height     Home Equipment: Agricultural consultant (2 wheels);Grab bars - tub/shower;Cane - quad          Prior Functioning/Environment Prior Level of Function : Driving;Independent/Modified Independent;Working/employed             Mobility Comments: hasn't been using AD, although does admit to furniture walking. 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)        OT Problem List: Decreased activity tolerance;Impaired balance (sitting and/or standing)      OT Treatment/Interventions: Self-care/ADL training;Therapeutic exercise;Energy conservation;DME and/or AE instruction;Therapeutic activities;Patient/family education;Balance training    OT Goals(Current goals can be found in the care plan section) Acute Rehab OT Goals Patient Stated Goal: return home OT Goal Formulation: With patient Time For Goal Achievement: 07/04/22 Potential to Achieve Goals: Good   OT Frequency: Min 1X/week    Co-evaluation              AM-PAC OT "6 Clicks"  Daily Activity     Outcome Measure Help from another person eating meals?: None Help from another person taking care of personal grooming?: A Little Help from another person toileting, which includes using toliet, bedpan, or urinal?: A Little Help from another person bathing (including washing, rinsing, drying)?: A Little Help from another person to put on and taking off regular upper body clothing?: None Help from another person to put on and taking off regular lower body clothing?: A Little 6 Click Score: 20   End of Session Equipment Utilized During Treatment: Gait belt Nurse Communication: Mobility status  Activity Tolerance: Patient tolerated treatment well Patient left: in chair;with call bell/phone within reach;with chair alarm set  OT Visit Diagnosis: Unsteadiness on feet (R26.81)                Time: 1610-9604 OT Time Calculation (min): 17 min Charges:  OT General Charges $OT Visit: 1 Visit OT Evaluation $OT Eval Low Complexity: 1 Low  [facility] MS, OTR/L ascom [phone]  06/20/22, 12:54 PM

## 2022-06-20 NOTE — Progress Notes (Signed)
  Transition of Care (TOC) Screening Note   Patient Details  Name: Brandi Reid Date of Birth: 03-09-1927   Transition of Care [facility]) CM/SW Contact:    Chapman Fitch, RN Phone Number: 06/20/2022, 1:41 PM    Transition of Care Department [facility]) has reviewed patient and no TOC needs have been identified at this time. We will continue to monitor patient advancement through interdisciplinary progression rounds. If new patient transition needs arise, please place a TOC consult.

## 2022-06-20 NOTE — Progress Notes (Signed)
AVS given and explained to patient. 

## 2022-08-23 ENCOUNTER — Other Ambulatory Visit: Payer: Self-pay | Admitting: Gastroenterology

## 2022-08-23 DIAGNOSIS — R9389 Abnormal findings on diagnostic imaging of other specified body structures: Secondary | ICD-10-CM

## 2022-08-23 DIAGNOSIS — R197 Diarrhea, unspecified: Secondary | ICD-10-CM

## 2022-08-24 ENCOUNTER — Other Ambulatory Visit: Payer: Self-pay | Admitting: Family Medicine

## 2022-08-24 DIAGNOSIS — R140 Abdominal distension (gaseous): Secondary | ICD-10-CM
